# Patient Record
Sex: Male | Born: 1945
Health system: Southern US, Community
[De-identification: ages and names within clinical notes are randomized; demographics above are authoritative.]

## PROBLEM LIST (undated history)

## (undated) DIAGNOSIS — R079 Chest pain, unspecified: Secondary | ICD-10-CM

## (undated) DIAGNOSIS — E7800 Pure hypercholesterolemia, unspecified: Secondary | ICD-10-CM

## (undated) DIAGNOSIS — M1990 Unspecified osteoarthritis, unspecified site: Secondary | ICD-10-CM

## (undated) DIAGNOSIS — J189 Pneumonia, unspecified organism: Secondary | ICD-10-CM

## (undated) HISTORY — DX: Unspecified osteoarthritis, unspecified site: M19.90

## (undated) HISTORY — DX: Chest pain, unspecified: R07.9

## (undated) HISTORY — DX: Pure hypercholesterolemia, unspecified: E78.00

## (undated) HISTORY — PX: LUMBAR DISC SURGERY: SHX700

## (undated) HISTORY — DX: Pneumonia, unspecified organism: J18.9

## (undated) HISTORY — PX: CARDIAC CATHETERIZATION: SHX172

---

## 1998-08-10 ENCOUNTER — Ambulatory Visit (HOSPITAL_COMMUNITY): Admission: RE | Admit: 1998-08-10 | Discharge: 1998-08-10 | Payer: Self-pay | Admitting: *Deleted

## 1999-01-05 ENCOUNTER — Emergency Department (HOSPITAL_COMMUNITY): Admission: EM | Admit: 1999-01-05 | Discharge: 1999-01-05 | Payer: Self-pay | Admitting: Emergency Medicine

## 2003-02-14 ENCOUNTER — Encounter: Payer: Self-pay | Admitting: Neurosurgery

## 2003-02-14 ENCOUNTER — Encounter: Payer: Self-pay | Admitting: Diagnostic Radiology

## 2003-02-14 ENCOUNTER — Encounter: Admission: RE | Admit: 2003-02-14 | Discharge: 2003-02-14 | Payer: Self-pay | Admitting: Neurosurgery

## 2003-03-17 ENCOUNTER — Encounter: Payer: Self-pay | Admitting: Neurosurgery

## 2003-03-17 ENCOUNTER — Encounter: Admission: RE | Admit: 2003-03-17 | Discharge: 2003-03-17 | Payer: Self-pay | Admitting: Neurosurgery

## 2004-01-25 ENCOUNTER — Inpatient Hospital Stay (HOSPITAL_COMMUNITY): Admission: AD | Admit: 2004-01-25 | Discharge: 2004-01-26 | Payer: Self-pay | Admitting: Cardiology

## 2004-01-26 ENCOUNTER — Encounter (INDEPENDENT_AMBULATORY_CARE_PROVIDER_SITE_OTHER): Payer: Self-pay | Admitting: *Deleted

## 2006-03-26 ENCOUNTER — Encounter: Payer: Self-pay | Admitting: Cardiology

## 2006-03-28 ENCOUNTER — Encounter: Payer: Self-pay | Admitting: Cardiology

## 2006-03-28 ENCOUNTER — Encounter: Payer: Self-pay | Admitting: Thoracic Surgery (Cardiothoracic Vascular Surgery)

## 2006-03-28 ENCOUNTER — Inpatient Hospital Stay (HOSPITAL_COMMUNITY)
Admission: AD | Admit: 2006-03-28 | Discharge: 2006-04-04 | Payer: Self-pay | Admitting: Thoracic Surgery (Cardiothoracic Vascular Surgery)

## 2006-04-02 ENCOUNTER — Encounter: Payer: Self-pay | Admitting: Thoracic Surgery (Cardiothoracic Vascular Surgery)

## 2006-04-13 ENCOUNTER — Encounter
Admission: RE | Admit: 2006-04-13 | Discharge: 2006-04-13 | Payer: Self-pay | Admitting: Thoracic Surgery (Cardiothoracic Vascular Surgery)

## 2010-02-27 ENCOUNTER — Encounter: Payer: Self-pay | Admitting: Cardiology

## 2010-09-30 ENCOUNTER — Encounter: Payer: Self-pay | Admitting: Cardiology

## 2010-11-13 ENCOUNTER — Encounter: Payer: Self-pay | Admitting: Cardiology

## 2010-11-21 ENCOUNTER — Encounter: Payer: Self-pay | Admitting: Cardiology

## 2010-11-21 DIAGNOSIS — R079 Chest pain, unspecified: Secondary | ICD-10-CM

## 2010-12-04 NOTE — Letter (Signed)
Summary: External Correspondence/ OFFICE VISIT [facility]  External Correspondence/ OFFICE VISIT [facility]   Imported By: Dorise Hiss 11/28/2010 09:53:41  _____________________________________________________________________  External Attachment:    Type:   Image     Comment:   External Document

## 2010-12-11 DIAGNOSIS — E7800 Pure hypercholesterolemia, unspecified: Secondary | ICD-10-CM | POA: Insufficient documentation

## 2010-12-11 DIAGNOSIS — R079 Chest pain, unspecified: Secondary | ICD-10-CM | POA: Insufficient documentation

## 2010-12-11 DIAGNOSIS — J90 Pleural effusion, not elsewhere classified: Secondary | ICD-10-CM | POA: Insufficient documentation

## 2010-12-12 ENCOUNTER — Ambulatory Visit: Payer: Self-pay | Admitting: Cardiology

## 2010-12-19 ENCOUNTER — Ambulatory Visit (INDEPENDENT_AMBULATORY_CARE_PROVIDER_SITE_OTHER): Payer: 59 | Admitting: Cardiology

## 2010-12-19 ENCOUNTER — Encounter: Payer: Self-pay | Admitting: Cardiology

## 2010-12-19 DIAGNOSIS — J869 Pyothorax without fistula: Secondary | ICD-10-CM | POA: Insufficient documentation

## 2010-12-19 DIAGNOSIS — R072 Precordial pain: Secondary | ICD-10-CM

## 2010-12-19 DIAGNOSIS — R9430 Abnormal result of cardiovascular function study, unspecified: Secondary | ICD-10-CM

## 2010-12-19 NOTE — Cardiovascular Report (Signed)
Summary: CARDIAC CATHETERIZATION  CARDIAC CATHETERIZATION   Imported By: Zachary George 12/11/2010 16:30:49  _____________________________________________________________________  External Attachment:    Type:   Image     Comment:   External Document

## 2010-12-19 NOTE — Op Note (Signed)
Summary: Operative Report/  [facility]  Operative Report/  [facility]   Imported By: Dorise Hiss 12/11/2010 16:31:34  _____________________________________________________________________  External Attachment:    Type:   Image     Comment:   External Document

## 2010-12-19 NOTE — Letter (Signed)
Summary: Discharge Summary  Discharge Summary   Imported By: Dorise Hiss 12/11/2010 16:35:24  _____________________________________________________________________  External Attachment:    Type:   Image     Comment:   External Document

## 2010-12-19 NOTE — Letter (Signed)
Summary: [facility] D/C   [facility] D/C   Imported By: Zachary George 12/11/2010 16:12:04  _____________________________________________________________________  External Attachment:    Type:   Image     Comment:   External Document

## 2010-12-19 NOTE — Letter (Signed)
Summary: [facility] D/C DR. Selinda Flavin  [facility] D/C DR. Selinda Flavin   Imported By: Zachary George 12/11/2010 16:07:04  _____________________________________________________________________  External Attachment:    Type:   Image     Comment:   External Document

## 2010-12-24 NOTE — Assessment & Plan Note (Signed)
Summary: NP-LAST SEEN IN 2005 [facility] PER CONVERSATION WITH DR. Leotis Shames...   Visit Type:  Initial Consult Primary Provider:  Prentiss Bells   History of Present Illness: The patient had a recent stress test.  He exercised for a total duration of 8 minutes and 59 seconds.  He was able to achieve 10 metabolic equivalents.  There was no ST segment depression.  He was in normal sinus rhythm.  He did achieve 85% of maximum predicted heart rate.  No chest pain was induced.  The ventricular systolic function was normal with an ejection fraction of 65%.  There was a large partially reversible apical and mid inferior inferoseptal defect associated with normal wall motion. The patient had blood work done by his primary care physician less than a year ago.  The patient had an HDL of 47-mg percent and an LDL hundred and 6 mg percent.  Total cholesterol was 173. The patient has a prior history of right middle lobe necrotizing pneumonia with abscess as well as effusion empyema that required VATS drainage and decortication procedure.  This was in 2007. The patient had a cardiac catheterization done in 2005.  He had normal coronary arteries and normal LV function. The patient reports that he had an episode approximately 3 to 4 weeks ago of substernal chest pain while in church.  This episode lasted approximately 10 minutes.  It felt sharp in nature with some radiation to the back.  There was no associated shortness of breath diaphoresis or dizziness.  The patient has remained active since that time. He goes frequently to the [facility].  When he works out he reports no symptoms.   Preventive Screening-Counseling & Management  Alcohol-Tobacco     Smoking Status: quit     Year Quit: 1969  Current Medications (verified): 1)  Lipitor 10 Mg Tabs (Atorvastatin Calcium) .... Take 1/2 Tablet By Mouth Once A Day 2)  Aspir-Low 81 Mg Tbec (Aspirin) .... Take 1 Tablet By Mouth Once A Day 3)  Multivitamins  Tabs (Multiple  Vitamin) .... Take 1 Tablet By Mouth Once A Day 4)  Amitriptyline Hcl 25 Mg Tabs (Amitriptyline Hcl) .... Take 1 Tablet By Mouth Once A Day 5)  Glucosamine Chondroitin Complx  Caps (Glucosamine-Chondroit-Vit C-Mn) .... Take 1 Tablet By Mouth Once A Day Move Free  Allergies (verified): No Known Drug Allergies  Comments:  Nurse/Medical Assistant: The patient's medication list and allergies were reviewed with the patient and were updated in the Medication and Allergy Lists.  Past History:  Past Medical History: Last updated: 12/11/2010 Chest pains hx of osteoarthritis History of hypercholesterolemia.  Right middle lobe necrotizing pneumonia with abscess, as well as      effusion and empyema, status post right VATS drainage of empyema and      decortication.  Past Surgical History: Last updated: 12/11/2010 Cardiac Catheterization 2005 2 lumbosacral disk surgeries (one in late 80;s and another one in 1998)  Right video-assisted thoracoscopic drainage of empyema.  Family History: Last updated: 09-Jan-2011 Father: died had CAD and DM.   Father was a smoker Mother: deceased CAD and DM  Social History: Last updated: 12/11/2010 Retired from [employer] Tobacco Use - No.  Alcohol Use - no Regular Exercise - yes  Risk Factors: Smoking Status: quit (01/09/11)  Family History: Father: died had CAD and DM.   Father was a smoker Mother: deceased CAD and DM  Social History: Smoking Status:  quit  Review of Systems  The patient denies fatigue, malaise, fever,  weight gain/loss, vision loss, decreased hearing, hoarseness, chest pain, palpitations, shortness of breath, prolonged cough, wheezing, sleep apnea, coughing up blood, abdominal pain, blood in stool, nausea, vomiting, diarrhea, heartburn, incontinence, blood in urine, muscle weakness, joint pain, leg swelling, rash, skin lesions, headache, fainting, dizziness, depression, anxiety, enlarged lymph nodes, easy bruising or  bleeding, and environmental allergies.    Vital Signs:  Patient profile:   65 year old male Height:      74 inches Weight:      180 pounds BMI:     23.19 Pulse rate:   69 / minute BP sitting:   129 / 77  (left arm) Cuff size:   regular  Vitals Entered By: Carlye Grippe (December 19, 2010 10:30 AM)  Physical Exam  Additional Exam:  General: Well-developed, well-nourished in no distress head: Normocephalic and atraumatic eyes PERRLA/EOMI intact, conjunctiva and lids normal nose: No deformity or lesions mouth normal dentition, normal posterior pharynx neck: Supple, no JVD.  No masses, thyromegaly or abnormal cervical nodes lungs: Normal breath sounds bilaterally without wheezing.  Normal percussion heart: regular rate and rhythm with normal S1 and S2, no S3 or S4.  PMI is normal.  No pathological murmurs abdomen: Normal bowel sounds, abdomen is soft and nontender without masses, organomegaly or hernias noted.  No hepatosplenomegaly musculoskeletal: Back normal, normal gait muscle strength and tone normal pulsus: Pulse is normal in all 4 extremities Extremities: No peripheral pitting edema neurologic: Alert and oriented x 3 skin: Intact without lesions or rashes cervical nodes: No significant adenopathy psychologic: Normal affect    Echocardiogram  Procedure date:  12/19/2010  Findings:       GE Vscan limited ECHO study was performed  there was normal LV and RV function.  There were no segmental wall motion abnormalities.  Aortic mitral and tricuspid valves appeared within normal limits.   Impression & Recommendations:  Problem # 1:  CHEST PAIN-UNSPECIFIED (ICD-786.50) equivocal nuclear perfusion study: Nuclear perfusion study showed an inferior defect which aspect in retrospect is likely diaphragmatic attenuation.  The patient had no chest pain and there were no EKG changes.  He had good exercise tolerance.  We performed a bedside echocardiogram in the office today and the  patient had normal wall motion and normal LV function.  There was no evidence of valvular heart disease.  At this point I do not think is an indication to proceed with a diagnostic cardiac catheterization.  The patient's pretest probability for coronary arteries he is very low.  I suspect false-positive Cardiolite perfusion study.  Of note is that he also had a normal cardiac catheterization in 2005. His updated medication list for this problem includes:    Aspir-low 81 Mg Tbec (Aspirin) .Marland Kitchen... Take 1 tablet by mouth once a day  Problem # 2:  PURE HYPERCHOLESTEROLEMIA (ICD-272.0) well-controlled on low-dose Lipitor. His updated medication list for this problem includes:    Lipitor 10 Mg Tabs (Atorvastatin calcium) .Marland Kitchen... Take 1/2 tablet by mouth once a day  Problem # 3:  EMPYEMA (ICD-510.9) the patient has a history of empyema and decortication procedure.   He has no recurrent problems and this is stable.  Patient Instructions: 1)  Your physician recommends that you continue on your current medications as directed. Please refer to the Current Medication list given to you today. 2)  Follow up as needed

## 2010-12-31 NOTE — Letter (Signed)
Summary: Internal Other/  PATIENT HISTORY FORM  Internal Other/  PATIENT HISTORY FORM   Imported By: Dorise Hiss 12/23/2010 15:56:11  _____________________________________________________________________  External Attachment:    Type:   Image     Comment:   External Document

## 2011-08-21 ENCOUNTER — Other Ambulatory Visit: Payer: Self-pay | Admitting: Neurosurgery

## 2011-08-21 DIAGNOSIS — M549 Dorsalgia, unspecified: Secondary | ICD-10-CM

## 2011-08-25 ENCOUNTER — Ambulatory Visit
Admission: RE | Admit: 2011-08-25 | Discharge: 2011-08-25 | Disposition: A | Discharge status: Home / Self Care | Payer: 59 | Source: Ambulatory Visit | Attending: Neurosurgery | Admitting: Neurosurgery

## 2011-08-25 VITALS — BP 117/75 | HR 59

## 2011-08-25 DIAGNOSIS — M549 Dorsalgia, unspecified: Secondary | ICD-10-CM

## 2011-08-25 MED ORDER — IOHEXOL 180 MG/ML  SOLN
16.0000 mL | Freq: Once | INTRAMUSCULAR | Status: AC | PRN
  Administered 2011-08-25: 16 mL via INTRATHECAL

## 2011-08-25 MED ORDER — DIAZEPAM 5 MG PO TABS
10.0000 mg | ORAL_TABLET | Freq: Once | ORAL | Status: AC
  Administered 2011-08-25: 10 mg via ORAL

## 2011-08-25 NOTE — Progress Notes (Signed)
States he has been off amitriptyline for the past two days.  jkl

## 2012-04-29 ENCOUNTER — Encounter: Payer: Self-pay | Admitting: Cardiology

## 2012-10-25 DIAGNOSIS — M5137 Other intervertebral disc degeneration, lumbosacral region: Secondary | ICD-10-CM | POA: Diagnosis not present

## 2012-10-25 DIAGNOSIS — M999 Biomechanical lesion, unspecified: Secondary | ICD-10-CM | POA: Diagnosis not present

## 2012-11-01 DIAGNOSIS — M999 Biomechanical lesion, unspecified: Secondary | ICD-10-CM | POA: Diagnosis not present

## 2012-11-01 DIAGNOSIS — M5137 Other intervertebral disc degeneration, lumbosacral region: Secondary | ICD-10-CM | POA: Diagnosis not present

## 2012-11-08 DIAGNOSIS — M5137 Other intervertebral disc degeneration, lumbosacral region: Secondary | ICD-10-CM | POA: Diagnosis not present

## 2012-11-08 DIAGNOSIS — M999 Biomechanical lesion, unspecified: Secondary | ICD-10-CM | POA: Diagnosis not present

## 2012-11-15 DIAGNOSIS — M5137 Other intervertebral disc degeneration, lumbosacral region: Secondary | ICD-10-CM | POA: Diagnosis not present

## 2012-11-15 DIAGNOSIS — M999 Biomechanical lesion, unspecified: Secondary | ICD-10-CM | POA: Diagnosis not present

## 2012-11-18 DIAGNOSIS — Z0000 Encounter for general adult medical examination without abnormal findings: Secondary | ICD-10-CM | POA: Diagnosis not present

## 2012-11-18 DIAGNOSIS — E7800 Pure hypercholesterolemia, unspecified: Secondary | ICD-10-CM | POA: Diagnosis not present

## 2012-11-22 DIAGNOSIS — M5137 Other intervertebral disc degeneration, lumbosacral region: Secondary | ICD-10-CM | POA: Diagnosis not present

## 2012-11-22 DIAGNOSIS — M999 Biomechanical lesion, unspecified: Secondary | ICD-10-CM | POA: Diagnosis not present

## 2012-11-29 DIAGNOSIS — M999 Biomechanical lesion, unspecified: Secondary | ICD-10-CM | POA: Diagnosis not present

## 2012-11-29 DIAGNOSIS — M5137 Other intervertebral disc degeneration, lumbosacral region: Secondary | ICD-10-CM | POA: Diagnosis not present

## 2012-12-06 DIAGNOSIS — M5137 Other intervertebral disc degeneration, lumbosacral region: Secondary | ICD-10-CM | POA: Diagnosis not present

## 2012-12-20 DIAGNOSIS — M999 Biomechanical lesion, unspecified: Secondary | ICD-10-CM | POA: Diagnosis not present

## 2012-12-27 DIAGNOSIS — M5137 Other intervertebral disc degeneration, lumbosacral region: Secondary | ICD-10-CM | POA: Diagnosis not present

## 2013-01-03 DIAGNOSIS — M5137 Other intervertebral disc degeneration, lumbosacral region: Secondary | ICD-10-CM | POA: Diagnosis not present

## 2013-01-03 DIAGNOSIS — M999 Biomechanical lesion, unspecified: Secondary | ICD-10-CM | POA: Diagnosis not present

## 2013-01-10 DIAGNOSIS — M999 Biomechanical lesion, unspecified: Secondary | ICD-10-CM | POA: Diagnosis not present

## 2013-01-10 DIAGNOSIS — M5137 Other intervertebral disc degeneration, lumbosacral region: Secondary | ICD-10-CM | POA: Diagnosis not present

## 2013-01-17 DIAGNOSIS — M5137 Other intervertebral disc degeneration, lumbosacral region: Secondary | ICD-10-CM | POA: Diagnosis not present

## 2013-01-17 DIAGNOSIS — M999 Biomechanical lesion, unspecified: Secondary | ICD-10-CM | POA: Diagnosis not present

## 2013-01-24 DIAGNOSIS — M5137 Other intervertebral disc degeneration, lumbosacral region: Secondary | ICD-10-CM | POA: Diagnosis not present

## 2013-01-24 DIAGNOSIS — M999 Biomechanical lesion, unspecified: Secondary | ICD-10-CM | POA: Diagnosis not present

## 2013-02-11 DIAGNOSIS — M25519 Pain in unspecified shoulder: Secondary | ICD-10-CM | POA: Diagnosis not present

## 2013-02-23 DIAGNOSIS — M19049 Primary osteoarthritis, unspecified hand: Secondary | ICD-10-CM | POA: Diagnosis not present

## 2013-02-23 DIAGNOSIS — M67919 Unspecified disorder of synovium and tendon, unspecified shoulder: Secondary | ICD-10-CM | POA: Diagnosis not present

## 2013-02-23 DIAGNOSIS — M75120 Complete rotator cuff tear or rupture of unspecified shoulder, not specified as traumatic: Secondary | ICD-10-CM | POA: Diagnosis not present

## 2013-03-02 DIAGNOSIS — M67919 Unspecified disorder of synovium and tendon, unspecified shoulder: Secondary | ICD-10-CM | POA: Diagnosis not present

## 2013-03-02 DIAGNOSIS — M75120 Complete rotator cuff tear or rupture of unspecified shoulder, not specified as traumatic: Secondary | ICD-10-CM | POA: Diagnosis not present

## 2013-03-02 DIAGNOSIS — M19049 Primary osteoarthritis, unspecified hand: Secondary | ICD-10-CM | POA: Diagnosis not present

## 2013-03-02 DIAGNOSIS — M719 Bursopathy, unspecified: Secondary | ICD-10-CM | POA: Diagnosis not present

## 2013-03-09 DIAGNOSIS — M67919 Unspecified disorder of synovium and tendon, unspecified shoulder: Secondary | ICD-10-CM | POA: Diagnosis not present

## 2013-03-09 DIAGNOSIS — M75120 Complete rotator cuff tear or rupture of unspecified shoulder, not specified as traumatic: Secondary | ICD-10-CM | POA: Diagnosis not present

## 2013-03-09 DIAGNOSIS — M719 Bursopathy, unspecified: Secondary | ICD-10-CM | POA: Diagnosis not present

## 2013-03-09 DIAGNOSIS — M19049 Primary osteoarthritis, unspecified hand: Secondary | ICD-10-CM | POA: Diagnosis not present

## 2013-03-16 DIAGNOSIS — M719 Bursopathy, unspecified: Secondary | ICD-10-CM | POA: Diagnosis not present

## 2013-03-16 DIAGNOSIS — M19049 Primary osteoarthritis, unspecified hand: Secondary | ICD-10-CM | POA: Diagnosis not present

## 2013-03-16 DIAGNOSIS — M75120 Complete rotator cuff tear or rupture of unspecified shoulder, not specified as traumatic: Secondary | ICD-10-CM | POA: Diagnosis not present

## 2013-03-16 DIAGNOSIS — M67919 Unspecified disorder of synovium and tendon, unspecified shoulder: Secondary | ICD-10-CM | POA: Diagnosis not present

## 2013-03-18 DIAGNOSIS — M67919 Unspecified disorder of synovium and tendon, unspecified shoulder: Secondary | ICD-10-CM | POA: Diagnosis not present

## 2013-03-18 DIAGNOSIS — M719 Bursopathy, unspecified: Secondary | ICD-10-CM | POA: Diagnosis not present

## 2013-03-18 DIAGNOSIS — M7710 Lateral epicondylitis, unspecified elbow: Secondary | ICD-10-CM | POA: Diagnosis not present

## 2013-03-18 DIAGNOSIS — M19049 Primary osteoarthritis, unspecified hand: Secondary | ICD-10-CM | POA: Diagnosis not present

## 2013-04-06 DIAGNOSIS — E7800 Pure hypercholesterolemia, unspecified: Secondary | ICD-10-CM | POA: Diagnosis not present

## 2013-07-20 DIAGNOSIS — Z23 Encounter for immunization: Secondary | ICD-10-CM | POA: Diagnosis not present

## 2013-09-07 DIAGNOSIS — M545 Low back pain: Secondary | ICD-10-CM | POA: Diagnosis not present

## 2013-09-07 DIAGNOSIS — Z23 Encounter for immunization: Secondary | ICD-10-CM | POA: Diagnosis not present

## 2013-09-07 DIAGNOSIS — Z79899 Other long term (current) drug therapy: Secondary | ICD-10-CM | POA: Diagnosis not present

## 2013-09-07 DIAGNOSIS — E7800 Pure hypercholesterolemia, unspecified: Secondary | ICD-10-CM | POA: Diagnosis not present

## 2013-09-07 DIAGNOSIS — Z125 Encounter for screening for malignant neoplasm of prostate: Secondary | ICD-10-CM | POA: Diagnosis not present

## 2013-09-07 DIAGNOSIS — Z0000 Encounter for general adult medical examination without abnormal findings: Secondary | ICD-10-CM | POA: Diagnosis not present

## 2013-10-10 DIAGNOSIS — Z79899 Other long term (current) drug therapy: Secondary | ICD-10-CM | POA: Diagnosis not present

## 2013-12-26 DIAGNOSIS — N390 Urinary tract infection, site not specified: Secondary | ICD-10-CM | POA: Diagnosis not present

## 2014-02-09 DIAGNOSIS — M5137 Other intervertebral disc degeneration, lumbosacral region: Secondary | ICD-10-CM | POA: Diagnosis not present

## 2014-02-09 DIAGNOSIS — M545 Low back pain, unspecified: Secondary | ICD-10-CM | POA: Diagnosis not present

## 2014-03-02 ENCOUNTER — Other Ambulatory Visit: Payer: Self-pay | Admitting: Geriatric Medicine

## 2014-03-02 ENCOUNTER — Ambulatory Visit
Admission: RE | Admit: 2014-03-02 | Discharge: 2014-03-02 | Disposition: A | Discharge status: Home / Self Care | Payer: Medicare Other | Source: Ambulatory Visit | Attending: Geriatric Medicine | Admitting: Geriatric Medicine

## 2014-03-02 DIAGNOSIS — R05 Cough: Secondary | ICD-10-CM

## 2014-03-02 DIAGNOSIS — R059 Cough, unspecified: Secondary | ICD-10-CM

## 2014-03-02 DIAGNOSIS — J398 Other specified diseases of upper respiratory tract: Secondary | ICD-10-CM | POA: Diagnosis not present

## 2014-03-02 DIAGNOSIS — J988 Other specified respiratory disorders: Secondary | ICD-10-CM | POA: Diagnosis not present

## 2014-04-13 DIAGNOSIS — M545 Low back pain, unspecified: Secondary | ICD-10-CM | POA: Diagnosis not present

## 2014-04-13 DIAGNOSIS — M5137 Other intervertebral disc degeneration, lumbosacral region: Secondary | ICD-10-CM | POA: Diagnosis not present

## 2014-05-11 DIAGNOSIS — M545 Low back pain, unspecified: Secondary | ICD-10-CM | POA: Diagnosis not present

## 2014-05-11 DIAGNOSIS — M5137 Other intervertebral disc degeneration, lumbosacral region: Secondary | ICD-10-CM | POA: Diagnosis not present

## 2014-06-12 DIAGNOSIS — M545 Low back pain, unspecified: Secondary | ICD-10-CM | POA: Diagnosis not present

## 2014-06-12 DIAGNOSIS — M5137 Other intervertebral disc degeneration, lumbosacral region: Secondary | ICD-10-CM | POA: Diagnosis not present

## 2014-07-12 DIAGNOSIS — M19049 Primary osteoarthritis, unspecified hand: Secondary | ICD-10-CM | POA: Diagnosis not present

## 2014-07-19 DIAGNOSIS — Z23 Encounter for immunization: Secondary | ICD-10-CM | POA: Diagnosis not present

## 2014-09-11 DIAGNOSIS — Z79899 Other long term (current) drug therapy: Secondary | ICD-10-CM | POA: Diagnosis not present

## 2014-09-11 DIAGNOSIS — Z23 Encounter for immunization: Secondary | ICD-10-CM | POA: Diagnosis not present

## 2014-09-11 DIAGNOSIS — E780 Pure hypercholesterolemia: Secondary | ICD-10-CM | POA: Diagnosis not present

## 2014-09-11 DIAGNOSIS — Z789 Other specified health status: Secondary | ICD-10-CM | POA: Diagnosis not present

## 2014-09-11 DIAGNOSIS — Z0000 Encounter for general adult medical examination without abnormal findings: Secondary | ICD-10-CM | POA: Diagnosis not present

## 2014-09-11 DIAGNOSIS — Z1389 Encounter for screening for other disorder: Secondary | ICD-10-CM | POA: Diagnosis not present

## 2015-06-04 DIAGNOSIS — D171 Benign lipomatous neoplasm of skin and subcutaneous tissue of trunk: Secondary | ICD-10-CM | POA: Diagnosis not present

## 2015-06-25 DIAGNOSIS — M25561 Pain in right knee: Secondary | ICD-10-CM | POA: Diagnosis not present

## 2015-07-04 DIAGNOSIS — J342 Deviated nasal septum: Secondary | ICD-10-CM | POA: Diagnosis not present

## 2015-07-04 DIAGNOSIS — R040 Epistaxis: Secondary | ICD-10-CM | POA: Diagnosis not present

## 2015-08-15 DIAGNOSIS — Z23 Encounter for immunization: Secondary | ICD-10-CM | POA: Diagnosis not present

## 2015-09-17 DIAGNOSIS — E7800 Pure hypercholesterolemia, unspecified: Secondary | ICD-10-CM | POA: Diagnosis not present

## 2015-09-17 DIAGNOSIS — Z79899 Other long term (current) drug therapy: Secondary | ICD-10-CM | POA: Diagnosis not present

## 2015-09-17 DIAGNOSIS — Z1389 Encounter for screening for other disorder: Secondary | ICD-10-CM | POA: Diagnosis not present

## 2015-09-17 DIAGNOSIS — Z0000 Encounter for general adult medical examination without abnormal findings: Secondary | ICD-10-CM | POA: Diagnosis not present

## 2015-09-17 DIAGNOSIS — D179 Benign lipomatous neoplasm, unspecified: Secondary | ICD-10-CM | POA: Diagnosis not present

## 2015-09-18 DIAGNOSIS — M62830 Muscle spasm of back: Secondary | ICD-10-CM | POA: Diagnosis not present

## 2015-09-18 DIAGNOSIS — M9904 Segmental and somatic dysfunction of sacral region: Secondary | ICD-10-CM | POA: Diagnosis not present

## 2015-09-18 DIAGNOSIS — M25551 Pain in right hip: Secondary | ICD-10-CM | POA: Diagnosis not present

## 2015-09-18 DIAGNOSIS — M25552 Pain in left hip: Secondary | ICD-10-CM | POA: Diagnosis not present

## 2015-09-18 DIAGNOSIS — M5136 Other intervertebral disc degeneration, lumbar region: Secondary | ICD-10-CM | POA: Diagnosis not present

## 2015-09-18 DIAGNOSIS — M5126 Other intervertebral disc displacement, lumbar region: Secondary | ICD-10-CM | POA: Diagnosis not present

## 2015-09-18 DIAGNOSIS — M9905 Segmental and somatic dysfunction of pelvic region: Secondary | ICD-10-CM | POA: Diagnosis not present

## 2015-09-18 DIAGNOSIS — M9903 Segmental and somatic dysfunction of lumbar region: Secondary | ICD-10-CM | POA: Diagnosis not present

## 2015-09-18 DIAGNOSIS — M5386 Other specified dorsopathies, lumbar region: Secondary | ICD-10-CM | POA: Diagnosis not present

## 2015-09-25 DIAGNOSIS — M25551 Pain in right hip: Secondary | ICD-10-CM | POA: Diagnosis not present

## 2015-09-25 DIAGNOSIS — M5386 Other specified dorsopathies, lumbar region: Secondary | ICD-10-CM | POA: Diagnosis not present

## 2015-09-25 DIAGNOSIS — M5126 Other intervertebral disc displacement, lumbar region: Secondary | ICD-10-CM | POA: Diagnosis not present

## 2015-09-25 DIAGNOSIS — M9904 Segmental and somatic dysfunction of sacral region: Secondary | ICD-10-CM | POA: Diagnosis not present

## 2015-09-25 DIAGNOSIS — M9905 Segmental and somatic dysfunction of pelvic region: Secondary | ICD-10-CM | POA: Diagnosis not present

## 2015-09-25 DIAGNOSIS — M5136 Other intervertebral disc degeneration, lumbar region: Secondary | ICD-10-CM | POA: Diagnosis not present

## 2015-09-25 DIAGNOSIS — M25552 Pain in left hip: Secondary | ICD-10-CM | POA: Diagnosis not present

## 2015-09-25 DIAGNOSIS — M9903 Segmental and somatic dysfunction of lumbar region: Secondary | ICD-10-CM | POA: Diagnosis not present

## 2015-09-25 DIAGNOSIS — M62830 Muscle spasm of back: Secondary | ICD-10-CM | POA: Diagnosis not present

## 2015-09-26 DIAGNOSIS — M5386 Other specified dorsopathies, lumbar region: Secondary | ICD-10-CM | POA: Diagnosis not present

## 2015-09-26 DIAGNOSIS — M9904 Segmental and somatic dysfunction of sacral region: Secondary | ICD-10-CM | POA: Diagnosis not present

## 2015-09-26 DIAGNOSIS — M5126 Other intervertebral disc displacement, lumbar region: Secondary | ICD-10-CM | POA: Diagnosis not present

## 2015-09-26 DIAGNOSIS — M25552 Pain in left hip: Secondary | ICD-10-CM | POA: Diagnosis not present

## 2015-09-26 DIAGNOSIS — M9903 Segmental and somatic dysfunction of lumbar region: Secondary | ICD-10-CM | POA: Diagnosis not present

## 2015-09-26 DIAGNOSIS — M25551 Pain in right hip: Secondary | ICD-10-CM | POA: Diagnosis not present

## 2015-09-26 DIAGNOSIS — M5136 Other intervertebral disc degeneration, lumbar region: Secondary | ICD-10-CM | POA: Diagnosis not present

## 2015-09-26 DIAGNOSIS — M9905 Segmental and somatic dysfunction of pelvic region: Secondary | ICD-10-CM | POA: Diagnosis not present

## 2015-09-26 DIAGNOSIS — M62830 Muscle spasm of back: Secondary | ICD-10-CM | POA: Diagnosis not present

## 2015-09-27 DIAGNOSIS — M5386 Other specified dorsopathies, lumbar region: Secondary | ICD-10-CM | POA: Diagnosis not present

## 2015-09-27 DIAGNOSIS — M25552 Pain in left hip: Secondary | ICD-10-CM | POA: Diagnosis not present

## 2015-09-27 DIAGNOSIS — M9904 Segmental and somatic dysfunction of sacral region: Secondary | ICD-10-CM | POA: Diagnosis not present

## 2015-09-27 DIAGNOSIS — M5126 Other intervertebral disc displacement, lumbar region: Secondary | ICD-10-CM | POA: Diagnosis not present

## 2015-09-27 DIAGNOSIS — M5136 Other intervertebral disc degeneration, lumbar region: Secondary | ICD-10-CM | POA: Diagnosis not present

## 2015-09-27 DIAGNOSIS — M62830 Muscle spasm of back: Secondary | ICD-10-CM | POA: Diagnosis not present

## 2015-09-27 DIAGNOSIS — M9905 Segmental and somatic dysfunction of pelvic region: Secondary | ICD-10-CM | POA: Diagnosis not present

## 2015-09-27 DIAGNOSIS — M25551 Pain in right hip: Secondary | ICD-10-CM | POA: Diagnosis not present

## 2015-09-27 DIAGNOSIS — M9903 Segmental and somatic dysfunction of lumbar region: Secondary | ICD-10-CM | POA: Diagnosis not present

## 2015-10-01 DIAGNOSIS — M5386 Other specified dorsopathies, lumbar region: Secondary | ICD-10-CM | POA: Diagnosis not present

## 2015-10-01 DIAGNOSIS — M25552 Pain in left hip: Secondary | ICD-10-CM | POA: Diagnosis not present

## 2015-10-01 DIAGNOSIS — M5126 Other intervertebral disc displacement, lumbar region: Secondary | ICD-10-CM | POA: Diagnosis not present

## 2015-10-01 DIAGNOSIS — M25551 Pain in right hip: Secondary | ICD-10-CM | POA: Diagnosis not present

## 2015-10-01 DIAGNOSIS — M5136 Other intervertebral disc degeneration, lumbar region: Secondary | ICD-10-CM | POA: Diagnosis not present

## 2015-10-01 DIAGNOSIS — M9905 Segmental and somatic dysfunction of pelvic region: Secondary | ICD-10-CM | POA: Diagnosis not present

## 2015-10-01 DIAGNOSIS — M9904 Segmental and somatic dysfunction of sacral region: Secondary | ICD-10-CM | POA: Diagnosis not present

## 2015-10-01 DIAGNOSIS — M62830 Muscle spasm of back: Secondary | ICD-10-CM | POA: Diagnosis not present

## 2015-10-01 DIAGNOSIS — M9903 Segmental and somatic dysfunction of lumbar region: Secondary | ICD-10-CM | POA: Diagnosis not present

## 2015-10-03 DIAGNOSIS — M9903 Segmental and somatic dysfunction of lumbar region: Secondary | ICD-10-CM | POA: Diagnosis not present

## 2015-10-03 DIAGNOSIS — M25552 Pain in left hip: Secondary | ICD-10-CM | POA: Diagnosis not present

## 2015-10-03 DIAGNOSIS — M9905 Segmental and somatic dysfunction of pelvic region: Secondary | ICD-10-CM | POA: Diagnosis not present

## 2015-10-03 DIAGNOSIS — M9904 Segmental and somatic dysfunction of sacral region: Secondary | ICD-10-CM | POA: Diagnosis not present

## 2015-10-03 DIAGNOSIS — M5386 Other specified dorsopathies, lumbar region: Secondary | ICD-10-CM | POA: Diagnosis not present

## 2015-10-03 DIAGNOSIS — M5136 Other intervertebral disc degeneration, lumbar region: Secondary | ICD-10-CM | POA: Diagnosis not present

## 2015-10-03 DIAGNOSIS — M25551 Pain in right hip: Secondary | ICD-10-CM | POA: Diagnosis not present

## 2015-10-03 DIAGNOSIS — M5126 Other intervertebral disc displacement, lumbar region: Secondary | ICD-10-CM | POA: Diagnosis not present

## 2015-10-03 DIAGNOSIS — M62830 Muscle spasm of back: Secondary | ICD-10-CM | POA: Diagnosis not present

## 2015-10-04 DIAGNOSIS — M9903 Segmental and somatic dysfunction of lumbar region: Secondary | ICD-10-CM | POA: Diagnosis not present

## 2015-10-04 DIAGNOSIS — M62830 Muscle spasm of back: Secondary | ICD-10-CM | POA: Diagnosis not present

## 2015-10-04 DIAGNOSIS — M9905 Segmental and somatic dysfunction of pelvic region: Secondary | ICD-10-CM | POA: Diagnosis not present

## 2015-10-04 DIAGNOSIS — M5136 Other intervertebral disc degeneration, lumbar region: Secondary | ICD-10-CM | POA: Diagnosis not present

## 2015-10-04 DIAGNOSIS — M9904 Segmental and somatic dysfunction of sacral region: Secondary | ICD-10-CM | POA: Diagnosis not present

## 2015-10-04 DIAGNOSIS — M25551 Pain in right hip: Secondary | ICD-10-CM | POA: Diagnosis not present

## 2015-10-04 DIAGNOSIS — M25552 Pain in left hip: Secondary | ICD-10-CM | POA: Diagnosis not present

## 2015-10-04 DIAGNOSIS — M5386 Other specified dorsopathies, lumbar region: Secondary | ICD-10-CM | POA: Diagnosis not present

## 2015-10-04 DIAGNOSIS — M5126 Other intervertebral disc displacement, lumbar region: Secondary | ICD-10-CM | POA: Diagnosis not present

## 2015-10-09 DIAGNOSIS — M25552 Pain in left hip: Secondary | ICD-10-CM | POA: Diagnosis not present

## 2015-10-09 DIAGNOSIS — M5126 Other intervertebral disc displacement, lumbar region: Secondary | ICD-10-CM | POA: Diagnosis not present

## 2015-10-09 DIAGNOSIS — M5386 Other specified dorsopathies, lumbar region: Secondary | ICD-10-CM | POA: Diagnosis not present

## 2015-10-09 DIAGNOSIS — M9904 Segmental and somatic dysfunction of sacral region: Secondary | ICD-10-CM | POA: Diagnosis not present

## 2015-10-09 DIAGNOSIS — M9905 Segmental and somatic dysfunction of pelvic region: Secondary | ICD-10-CM | POA: Diagnosis not present

## 2015-10-09 DIAGNOSIS — M62830 Muscle spasm of back: Secondary | ICD-10-CM | POA: Diagnosis not present

## 2015-10-09 DIAGNOSIS — M25551 Pain in right hip: Secondary | ICD-10-CM | POA: Diagnosis not present

## 2015-10-09 DIAGNOSIS — M9903 Segmental and somatic dysfunction of lumbar region: Secondary | ICD-10-CM | POA: Diagnosis not present

## 2015-10-09 DIAGNOSIS — M5136 Other intervertebral disc degeneration, lumbar region: Secondary | ICD-10-CM | POA: Diagnosis not present

## 2015-10-10 DIAGNOSIS — M9905 Segmental and somatic dysfunction of pelvic region: Secondary | ICD-10-CM | POA: Diagnosis not present

## 2015-10-10 DIAGNOSIS — M25551 Pain in right hip: Secondary | ICD-10-CM | POA: Diagnosis not present

## 2015-10-10 DIAGNOSIS — M25552 Pain in left hip: Secondary | ICD-10-CM | POA: Diagnosis not present

## 2015-10-10 DIAGNOSIS — M5126 Other intervertebral disc displacement, lumbar region: Secondary | ICD-10-CM | POA: Diagnosis not present

## 2015-10-10 DIAGNOSIS — M9903 Segmental and somatic dysfunction of lumbar region: Secondary | ICD-10-CM | POA: Diagnosis not present

## 2015-10-10 DIAGNOSIS — M5386 Other specified dorsopathies, lumbar region: Secondary | ICD-10-CM | POA: Diagnosis not present

## 2015-10-10 DIAGNOSIS — M5136 Other intervertebral disc degeneration, lumbar region: Secondary | ICD-10-CM | POA: Diagnosis not present

## 2015-10-10 DIAGNOSIS — M62830 Muscle spasm of back: Secondary | ICD-10-CM | POA: Diagnosis not present

## 2015-10-10 DIAGNOSIS — M9904 Segmental and somatic dysfunction of sacral region: Secondary | ICD-10-CM | POA: Diagnosis not present

## 2015-10-11 DIAGNOSIS — M5126 Other intervertebral disc displacement, lumbar region: Secondary | ICD-10-CM | POA: Diagnosis not present

## 2015-10-11 DIAGNOSIS — M5136 Other intervertebral disc degeneration, lumbar region: Secondary | ICD-10-CM | POA: Diagnosis not present

## 2015-10-11 DIAGNOSIS — M25552 Pain in left hip: Secondary | ICD-10-CM | POA: Diagnosis not present

## 2015-10-11 DIAGNOSIS — M9904 Segmental and somatic dysfunction of sacral region: Secondary | ICD-10-CM | POA: Diagnosis not present

## 2015-10-11 DIAGNOSIS — M9903 Segmental and somatic dysfunction of lumbar region: Secondary | ICD-10-CM | POA: Diagnosis not present

## 2015-10-11 DIAGNOSIS — M9905 Segmental and somatic dysfunction of pelvic region: Secondary | ICD-10-CM | POA: Diagnosis not present

## 2015-10-11 DIAGNOSIS — M25551 Pain in right hip: Secondary | ICD-10-CM | POA: Diagnosis not present

## 2015-10-11 DIAGNOSIS — M5386 Other specified dorsopathies, lumbar region: Secondary | ICD-10-CM | POA: Diagnosis not present

## 2015-10-11 DIAGNOSIS — M62830 Muscle spasm of back: Secondary | ICD-10-CM | POA: Diagnosis not present

## 2015-10-16 DIAGNOSIS — M9903 Segmental and somatic dysfunction of lumbar region: Secondary | ICD-10-CM | POA: Diagnosis not present

## 2015-10-16 DIAGNOSIS — M25551 Pain in right hip: Secondary | ICD-10-CM | POA: Diagnosis not present

## 2015-10-16 DIAGNOSIS — M25552 Pain in left hip: Secondary | ICD-10-CM | POA: Diagnosis not present

## 2015-10-16 DIAGNOSIS — M5386 Other specified dorsopathies, lumbar region: Secondary | ICD-10-CM | POA: Diagnosis not present

## 2015-10-16 DIAGNOSIS — M9905 Segmental and somatic dysfunction of pelvic region: Secondary | ICD-10-CM | POA: Diagnosis not present

## 2015-10-16 DIAGNOSIS — M62830 Muscle spasm of back: Secondary | ICD-10-CM | POA: Diagnosis not present

## 2015-10-16 DIAGNOSIS — M5136 Other intervertebral disc degeneration, lumbar region: Secondary | ICD-10-CM | POA: Diagnosis not present

## 2015-10-16 DIAGNOSIS — M9904 Segmental and somatic dysfunction of sacral region: Secondary | ICD-10-CM | POA: Diagnosis not present

## 2015-10-16 DIAGNOSIS — M5126 Other intervertebral disc displacement, lumbar region: Secondary | ICD-10-CM | POA: Diagnosis not present

## 2015-10-18 DIAGNOSIS — M9904 Segmental and somatic dysfunction of sacral region: Secondary | ICD-10-CM | POA: Diagnosis not present

## 2015-10-18 DIAGNOSIS — M62830 Muscle spasm of back: Secondary | ICD-10-CM | POA: Diagnosis not present

## 2015-10-18 DIAGNOSIS — M9903 Segmental and somatic dysfunction of lumbar region: Secondary | ICD-10-CM | POA: Diagnosis not present

## 2015-10-18 DIAGNOSIS — M25552 Pain in left hip: Secondary | ICD-10-CM | POA: Diagnosis not present

## 2015-10-18 DIAGNOSIS — M5386 Other specified dorsopathies, lumbar region: Secondary | ICD-10-CM | POA: Diagnosis not present

## 2015-10-18 DIAGNOSIS — M5126 Other intervertebral disc displacement, lumbar region: Secondary | ICD-10-CM | POA: Diagnosis not present

## 2015-10-18 DIAGNOSIS — M25551 Pain in right hip: Secondary | ICD-10-CM | POA: Diagnosis not present

## 2015-10-18 DIAGNOSIS — M9905 Segmental and somatic dysfunction of pelvic region: Secondary | ICD-10-CM | POA: Diagnosis not present

## 2015-10-18 DIAGNOSIS — M5136 Other intervertebral disc degeneration, lumbar region: Secondary | ICD-10-CM | POA: Diagnosis not present

## 2015-10-23 DIAGNOSIS — M25551 Pain in right hip: Secondary | ICD-10-CM | POA: Diagnosis not present

## 2015-10-23 DIAGNOSIS — M9904 Segmental and somatic dysfunction of sacral region: Secondary | ICD-10-CM | POA: Diagnosis not present

## 2015-10-23 DIAGNOSIS — M5136 Other intervertebral disc degeneration, lumbar region: Secondary | ICD-10-CM | POA: Diagnosis not present

## 2015-10-23 DIAGNOSIS — M9905 Segmental and somatic dysfunction of pelvic region: Secondary | ICD-10-CM | POA: Diagnosis not present

## 2015-10-23 DIAGNOSIS — M5126 Other intervertebral disc displacement, lumbar region: Secondary | ICD-10-CM | POA: Diagnosis not present

## 2015-10-23 DIAGNOSIS — M5386 Other specified dorsopathies, lumbar region: Secondary | ICD-10-CM | POA: Diagnosis not present

## 2015-10-23 DIAGNOSIS — M9903 Segmental and somatic dysfunction of lumbar region: Secondary | ICD-10-CM | POA: Diagnosis not present

## 2015-10-23 DIAGNOSIS — M25552 Pain in left hip: Secondary | ICD-10-CM | POA: Diagnosis not present

## 2015-10-23 DIAGNOSIS — M62830 Muscle spasm of back: Secondary | ICD-10-CM | POA: Diagnosis not present

## 2015-10-24 DIAGNOSIS — M25552 Pain in left hip: Secondary | ICD-10-CM | POA: Diagnosis not present

## 2015-10-24 DIAGNOSIS — M9905 Segmental and somatic dysfunction of pelvic region: Secondary | ICD-10-CM | POA: Diagnosis not present

## 2015-10-24 DIAGNOSIS — M5126 Other intervertebral disc displacement, lumbar region: Secondary | ICD-10-CM | POA: Diagnosis not present

## 2015-10-24 DIAGNOSIS — M9904 Segmental and somatic dysfunction of sacral region: Secondary | ICD-10-CM | POA: Diagnosis not present

## 2015-10-24 DIAGNOSIS — M5386 Other specified dorsopathies, lumbar region: Secondary | ICD-10-CM | POA: Diagnosis not present

## 2015-10-24 DIAGNOSIS — M62830 Muscle spasm of back: Secondary | ICD-10-CM | POA: Diagnosis not present

## 2015-10-24 DIAGNOSIS — M5136 Other intervertebral disc degeneration, lumbar region: Secondary | ICD-10-CM | POA: Diagnosis not present

## 2015-10-24 DIAGNOSIS — M25551 Pain in right hip: Secondary | ICD-10-CM | POA: Diagnosis not present

## 2015-10-24 DIAGNOSIS — M9903 Segmental and somatic dysfunction of lumbar region: Secondary | ICD-10-CM | POA: Diagnosis not present

## 2015-10-29 DIAGNOSIS — M25551 Pain in right hip: Secondary | ICD-10-CM | POA: Diagnosis not present

## 2015-10-29 DIAGNOSIS — M5386 Other specified dorsopathies, lumbar region: Secondary | ICD-10-CM | POA: Diagnosis not present

## 2015-10-29 DIAGNOSIS — M5126 Other intervertebral disc displacement, lumbar region: Secondary | ICD-10-CM | POA: Diagnosis not present

## 2015-10-29 DIAGNOSIS — M9903 Segmental and somatic dysfunction of lumbar region: Secondary | ICD-10-CM | POA: Diagnosis not present

## 2015-10-29 DIAGNOSIS — M62830 Muscle spasm of back: Secondary | ICD-10-CM | POA: Diagnosis not present

## 2015-10-29 DIAGNOSIS — M5136 Other intervertebral disc degeneration, lumbar region: Secondary | ICD-10-CM | POA: Diagnosis not present

## 2015-10-29 DIAGNOSIS — M25552 Pain in left hip: Secondary | ICD-10-CM | POA: Diagnosis not present

## 2015-10-29 DIAGNOSIS — M9905 Segmental and somatic dysfunction of pelvic region: Secondary | ICD-10-CM | POA: Diagnosis not present

## 2015-10-29 DIAGNOSIS — M9904 Segmental and somatic dysfunction of sacral region: Secondary | ICD-10-CM | POA: Diagnosis not present

## 2015-11-01 DIAGNOSIS — M9903 Segmental and somatic dysfunction of lumbar region: Secondary | ICD-10-CM | POA: Diagnosis not present

## 2015-11-01 DIAGNOSIS — M25551 Pain in right hip: Secondary | ICD-10-CM | POA: Diagnosis not present

## 2015-11-01 DIAGNOSIS — M9904 Segmental and somatic dysfunction of sacral region: Secondary | ICD-10-CM | POA: Diagnosis not present

## 2015-11-01 DIAGNOSIS — M25552 Pain in left hip: Secondary | ICD-10-CM | POA: Diagnosis not present

## 2015-11-01 DIAGNOSIS — M62830 Muscle spasm of back: Secondary | ICD-10-CM | POA: Diagnosis not present

## 2015-11-01 DIAGNOSIS — M5386 Other specified dorsopathies, lumbar region: Secondary | ICD-10-CM | POA: Diagnosis not present

## 2015-11-01 DIAGNOSIS — M5126 Other intervertebral disc displacement, lumbar region: Secondary | ICD-10-CM | POA: Diagnosis not present

## 2015-11-01 DIAGNOSIS — M5136 Other intervertebral disc degeneration, lumbar region: Secondary | ICD-10-CM | POA: Diagnosis not present

## 2015-11-01 DIAGNOSIS — M9905 Segmental and somatic dysfunction of pelvic region: Secondary | ICD-10-CM | POA: Diagnosis not present

## 2015-11-07 DIAGNOSIS — M5136 Other intervertebral disc degeneration, lumbar region: Secondary | ICD-10-CM | POA: Diagnosis not present

## 2015-11-07 DIAGNOSIS — M9903 Segmental and somatic dysfunction of lumbar region: Secondary | ICD-10-CM | POA: Diagnosis not present

## 2015-11-07 DIAGNOSIS — M9904 Segmental and somatic dysfunction of sacral region: Secondary | ICD-10-CM | POA: Diagnosis not present

## 2015-11-07 DIAGNOSIS — M9905 Segmental and somatic dysfunction of pelvic region: Secondary | ICD-10-CM | POA: Diagnosis not present

## 2015-11-07 DIAGNOSIS — M25552 Pain in left hip: Secondary | ICD-10-CM | POA: Diagnosis not present

## 2015-11-07 DIAGNOSIS — M25551 Pain in right hip: Secondary | ICD-10-CM | POA: Diagnosis not present

## 2015-11-07 DIAGNOSIS — M5386 Other specified dorsopathies, lumbar region: Secondary | ICD-10-CM | POA: Diagnosis not present

## 2015-11-07 DIAGNOSIS — M5126 Other intervertebral disc displacement, lumbar region: Secondary | ICD-10-CM | POA: Diagnosis not present

## 2015-11-07 DIAGNOSIS — M62830 Muscle spasm of back: Secondary | ICD-10-CM | POA: Diagnosis not present

## 2015-11-29 DIAGNOSIS — M9905 Segmental and somatic dysfunction of pelvic region: Secondary | ICD-10-CM | POA: Diagnosis not present

## 2015-11-29 DIAGNOSIS — M25552 Pain in left hip: Secondary | ICD-10-CM | POA: Diagnosis not present

## 2015-11-29 DIAGNOSIS — M5126 Other intervertebral disc displacement, lumbar region: Secondary | ICD-10-CM | POA: Diagnosis not present

## 2015-11-29 DIAGNOSIS — M5386 Other specified dorsopathies, lumbar region: Secondary | ICD-10-CM | POA: Diagnosis not present

## 2015-11-29 DIAGNOSIS — M9903 Segmental and somatic dysfunction of lumbar region: Secondary | ICD-10-CM | POA: Diagnosis not present

## 2015-11-29 DIAGNOSIS — M25551 Pain in right hip: Secondary | ICD-10-CM | POA: Diagnosis not present

## 2015-11-29 DIAGNOSIS — M5136 Other intervertebral disc degeneration, lumbar region: Secondary | ICD-10-CM | POA: Diagnosis not present

## 2015-11-29 DIAGNOSIS — M62830 Muscle spasm of back: Secondary | ICD-10-CM | POA: Diagnosis not present

## 2015-11-29 DIAGNOSIS — M9904 Segmental and somatic dysfunction of sacral region: Secondary | ICD-10-CM | POA: Diagnosis not present

## 2016-02-29 DIAGNOSIS — M79675 Pain in left toe(s): Secondary | ICD-10-CM | POA: Diagnosis not present

## 2016-03-31 DIAGNOSIS — M4186 Other forms of scoliosis, lumbar region: Secondary | ICD-10-CM | POA: Diagnosis not present

## 2016-03-31 DIAGNOSIS — M47817 Spondylosis without myelopathy or radiculopathy, lumbosacral region: Secondary | ICD-10-CM | POA: Diagnosis not present

## 2016-03-31 DIAGNOSIS — M47816 Spondylosis without myelopathy or radiculopathy, lumbar region: Secondary | ICD-10-CM | POA: Diagnosis not present

## 2016-07-09 DIAGNOSIS — Z23 Encounter for immunization: Secondary | ICD-10-CM | POA: Diagnosis not present

## 2016-09-25 DIAGNOSIS — Z1389 Encounter for screening for other disorder: Secondary | ICD-10-CM | POA: Diagnosis not present

## 2016-09-25 DIAGNOSIS — G8929 Other chronic pain: Secondary | ICD-10-CM | POA: Diagnosis not present

## 2016-09-25 DIAGNOSIS — F5101 Primary insomnia: Secondary | ICD-10-CM | POA: Diagnosis not present

## 2016-09-25 DIAGNOSIS — Z125 Encounter for screening for malignant neoplasm of prostate: Secondary | ICD-10-CM | POA: Diagnosis not present

## 2016-09-25 DIAGNOSIS — E7800 Pure hypercholesterolemia, unspecified: Secondary | ICD-10-CM | POA: Diagnosis not present

## 2016-09-25 DIAGNOSIS — M545 Low back pain: Secondary | ICD-10-CM | POA: Diagnosis not present

## 2016-09-25 DIAGNOSIS — Z0000 Encounter for general adult medical examination without abnormal findings: Secondary | ICD-10-CM | POA: Diagnosis not present

## 2016-09-25 DIAGNOSIS — Z79899 Other long term (current) drug therapy: Secondary | ICD-10-CM | POA: Diagnosis not present

## 2016-09-30 DIAGNOSIS — M545 Low back pain: Secondary | ICD-10-CM | POA: Diagnosis not present

## 2016-10-09 DIAGNOSIS — M545 Low back pain: Secondary | ICD-10-CM | POA: Diagnosis not present

## 2016-12-22 ENCOUNTER — Ambulatory Visit (HOSPITAL_COMMUNITY)
Admission: EM | Admit: 2016-12-22 | Discharge: 2016-12-22 | Disposition: A | Discharge status: Home / Self Care | Payer: Medicare Other | Attending: Emergency Medicine | Admitting: Emergency Medicine

## 2016-12-22 ENCOUNTER — Encounter (HOSPITAL_COMMUNITY): Payer: Self-pay | Admitting: Emergency Medicine

## 2016-12-22 ENCOUNTER — Ambulatory Visit (INDEPENDENT_AMBULATORY_CARE_PROVIDER_SITE_OTHER): Payer: Medicare Other

## 2016-12-22 DIAGNOSIS — R0781 Pleurodynia: Secondary | ICD-10-CM | POA: Diagnosis not present

## 2016-12-22 DIAGNOSIS — S299XXA Unspecified injury of thorax, initial encounter: Secondary | ICD-10-CM | POA: Diagnosis not present

## 2016-12-22 DIAGNOSIS — R079 Chest pain, unspecified: Secondary | ICD-10-CM | POA: Diagnosis not present

## 2016-12-22 MED ORDER — DICLOFENAC SODIUM 75 MG PO TBEC: 75.0000 mg | DELAYED_RELEASE_TABLET | Freq: Two times a day (BID) | ORAL | 0 refills | Status: DC

## 2016-12-22 NOTE — ED Triage Notes (Signed)
Pt. Stated, I fell in the snow on the left side. Called my doctor and said to come here.  I think I broke one of my ribs.

## 2016-12-22 NOTE — Discharge Instructions (Addendum)
There is no evidence of fracture on x-ray. However there was an area of "thickening" on the x-ray. And the radiologist recommends some point in the future, on a non-emergency basis, that you have a follow-up CT to rule out a mass. I have provided a copy of the radiology report, I recommend you take this to your primary care provider sometime in the next week to the next month any have a discussion with him regarding appropriate follow-up care. With regard to your pain, I prescribed a medicine called diclofenac, take one tablet twice a day. I recommend rest, you may apply ice to the affected area 15 minutes at a time up to 4 times a day, he may alternate with heat. He can take several weeks for the pain in the ribs to heal.

## 2016-12-22 NOTE — ED Provider Notes (Signed)
CSN: [id number]     Arrival date & time 12/22/16  1834 History   None    Chief Complaint  Patient presents with  . Fall  . Chest Pain    left   (Consider location/radiation/quality/duration/timing/severity/associated sxs/prior Treatment) 71 year old male resents to clinic for evaluation of left-sided rib pain. He reports he had slipped on some concrete earlier today in the snow landing on his side. He did not strike his head, no loss of consciousness, no headache, blurred vision, nausea vomiting, or other systemic signs of distress. He has had no shortness of breath, no hemoptysis, no exertional dyspnea. His pain is worse with deep inspiration, and worse when laying on the affected side. He denies any other complaints.   The history is provided by the patient.    Past Medical History:  Diagnosis Date  . Chest pain   . Hypercholesterolemia   . Osteoarthritis   . Pneumonia    right middle lobe necrotizing pneumonia with abcess as well ass effusion and empyena and decortication   Past Surgical History:  Procedure Laterality Date  . CARDIAC CATHETERIZATION     2005  . LUMBAR DISC SURGERY     Family History  Problem Relation Age of Onset  . Heart disease Father    Social History  Substance Use Topics  . Smoking status: Never Smoker  . Smokeless tobacco: Never Used  . Alcohol use Yes    Review of Systems  Reason unable to perform ROS: as covered in HPI.  All other systems reviewed and are negative.   Allergies  Patient has no known allergies.  Home Medications   Prior to Admission medications   Medication Sig Start Date End Date Taking? Authorizing Provider  amitriptyline (ELAVIL) 25 MG tablet Take 25 mg by mouth at bedtime.    Historical Provider, MD  aspirin 81 MG tablet Take 81 mg by mouth daily.    Historical Provider, MD  atorvastatin (LIPITOR) 10 MG tablet Take 10 mg by mouth daily.    Historical Provider, MD  diclofenac (VOLTAREN) 75 MG EC tablet Take 1 tablet  (75 mg total) by mouth 2 (two) times daily. 12/22/16   Barnet Glasgow, NP  glucosamine-chondroitin 500-400 MG tablet Take 0.5 tablets by mouth 3 (three) times daily.    Historical Provider, MD  Multiple Vitamins-Minerals (MULTIVITAMIN PO) Take by mouth.    Historical Provider, MD   Meds Ordered and Administered this Visit  Medications - No data to display  BP 123/63 (BP Location: Right Arm)   Pulse 68   Temp 98.2 F (36.8 C) (Oral)   Resp 17   Ht 6' 1.5" (1.867 m)   Wt 175 lb (79.4 kg)   SpO2 98%   BMI 22.78 kg/m  No data found.   Physical Exam  Constitutional: He is oriented to person, place, and time. He appears well-developed and well-nourished. No distress.  HENT:  Head: Normocephalic and atraumatic.  Right Ear: External ear normal.  Left Ear: External ear normal.  Neck: Normal range of motion. Neck supple.  Cardiovascular: Normal rate and regular rhythm.   Pulmonary/Chest: Effort normal and breath sounds normal. No respiratory distress. He has no wheezes. He exhibits tenderness. He exhibits no crepitus, no deformity and no retraction.  Left sided tenderness, mid axillary at the level of the 6th rib  Abdominal: Soft. Bowel sounds are normal.  Neurological: He is alert and oriented to person, place, and time.  Skin: Skin is warm and dry. Capillary refill takes  less than 2 seconds. He is not diaphoretic.  Psychiatric: He has a normal mood and affect. His behavior is normal.  Nursing note and vitals reviewed.   Urgent Care Course     Procedures (including critical care time)  Labs Review Labs Reviewed - No data to display  Imaging Review Dg Ribs Unilateral W/chest Left  Result Date: 12/22/2016 CLINICAL DATA:  71 year old male with fall and chest pain. EXAM: LEFT RIBS AND CHEST - 3+ VIEW COMPARISON:  Chest radiograph dated 03/02/2014 FINDINGS: There is a focal area of streaky density and parenchymal prominence in the right infrahilar region which appears slightly more  prominent compared to the prior radiograph. This likely is related to atelectatic changes/ scarring and superimposition of the structures. A developing mass is much less likely but not entirely excluded. This can be further evaluated with chest CT on a non emergent basis. The lungs are otherwise clear. There is no pleural effusion or pneumothorax. The cardiac silhouette is within normal limits. The aorta is mildly tortuous. The bones are osteopenic. No acute osseous pathology identified. IMPRESSION: 1. No acute cardiopulmonary process. No definite fracture identified. 2. Minimal prominence of the right infrahilar region, likely combination of superimposition of the hilar vessels and atelectasis/scarring. A developing mass is much less likely but not entirely excluded. Electronically Signed   By: Anner Crete M.D.   On: 12/22/2016 20:01       MDM   1. Rib pain on left side    There is no evidence of fracture on x-ray. However there was an area of "thickening" on the x-ray. And the radiologist recommends some point in the future, on a non-emergency basis, that you have a follow-up CT to rule out a mass. I have provided a copy of the radiology report, I recommend you take this to your primary care provider sometime in the next week to the next month any have a discussion with him regarding appropriate follow-up care. With regard to your pain, I prescribed a medicine called diclofenac, take one tablet twice a day. I recommend rest, you may apply ice to the affected area 15 minutes at a time up to 4 times a day, he may alternate with heat. He can take several weeks for the pain in the ribs to heal.      Barnet Glasgow, NP 12/22/16 2150

## 2016-12-25 ENCOUNTER — Ambulatory Visit
Admission: RE | Admit: 2016-12-25 | Discharge: 2016-12-25 | Disposition: A | Discharge status: Home / Self Care | Payer: Medicare Other | Source: Ambulatory Visit | Attending: Geriatric Medicine | Admitting: Geriatric Medicine

## 2016-12-25 ENCOUNTER — Other Ambulatory Visit: Payer: Self-pay | Admitting: Geriatric Medicine

## 2016-12-25 DIAGNOSIS — R0789 Other chest pain: Secondary | ICD-10-CM

## 2016-12-25 DIAGNOSIS — R938 Abnormal findings on diagnostic imaging of other specified body structures: Secondary | ICD-10-CM | POA: Diagnosis not present

## 2016-12-25 DIAGNOSIS — R0602 Shortness of breath: Secondary | ICD-10-CM | POA: Diagnosis not present

## 2016-12-26 ENCOUNTER — Ambulatory Visit
Admission: RE | Admit: 2016-12-26 | Discharge: 2016-12-26 | Disposition: A | Discharge status: Home / Self Care | Payer: Medicare Other | Source: Ambulatory Visit | Attending: Geriatric Medicine | Admitting: Geriatric Medicine

## 2016-12-26 ENCOUNTER — Other Ambulatory Visit: Payer: Self-pay | Admitting: Geriatric Medicine

## 2016-12-26 DIAGNOSIS — R079 Chest pain, unspecified: Secondary | ICD-10-CM | POA: Diagnosis not present

## 2016-12-26 DIAGNOSIS — J939 Pneumothorax, unspecified: Secondary | ICD-10-CM

## 2016-12-29 ENCOUNTER — Other Ambulatory Visit: Payer: Self-pay | Admitting: Geriatric Medicine

## 2016-12-29 ENCOUNTER — Ambulatory Visit
Admission: RE | Admit: 2016-12-29 | Discharge: 2016-12-29 | Disposition: A | Discharge status: Home / Self Care | Payer: Medicare Other | Source: Ambulatory Visit | Attending: Geriatric Medicine | Admitting: Geriatric Medicine

## 2016-12-29 DIAGNOSIS — J939 Pneumothorax, unspecified: Secondary | ICD-10-CM

## 2017-01-07 ENCOUNTER — Other Ambulatory Visit: Payer: Self-pay | Admitting: Geriatric Medicine

## 2017-01-07 ENCOUNTER — Ambulatory Visit
Admission: RE | Admit: 2017-01-07 | Discharge: 2017-01-07 | Disposition: A | Discharge status: Home / Self Care | Payer: Medicare Other | Source: Ambulatory Visit | Attending: Geriatric Medicine | Admitting: Geriatric Medicine

## 2017-01-07 DIAGNOSIS — R0602 Shortness of breath: Secondary | ICD-10-CM | POA: Diagnosis not present

## 2017-01-07 DIAGNOSIS — S270XXS Traumatic pneumothorax, sequela: Secondary | ICD-10-CM

## 2017-01-28 ENCOUNTER — Ambulatory Visit
Admission: RE | Admit: 2017-01-28 | Discharge: 2017-01-28 | Disposition: A | Discharge status: Home / Self Care | Payer: Medicare Other | Source: Ambulatory Visit | Attending: Geriatric Medicine | Admitting: Geriatric Medicine

## 2017-01-28 ENCOUNTER — Other Ambulatory Visit: Payer: Self-pay | Admitting: Geriatric Medicine

## 2017-01-28 DIAGNOSIS — R0602 Shortness of breath: Secondary | ICD-10-CM | POA: Diagnosis not present

## 2017-01-28 DIAGNOSIS — R918 Other nonspecific abnormal finding of lung field: Secondary | ICD-10-CM

## 2017-01-28 DIAGNOSIS — R05 Cough: Secondary | ICD-10-CM | POA: Diagnosis not present

## 2017-02-12 ENCOUNTER — Other Ambulatory Visit: Payer: Self-pay | Admitting: Geriatric Medicine

## 2017-02-12 ENCOUNTER — Ambulatory Visit
Admission: RE | Admit: 2017-02-12 | Discharge: 2017-02-12 | Disposition: A | Discharge status: Home / Self Care | Payer: Medicare Other | Source: Ambulatory Visit | Attending: Geriatric Medicine | Admitting: Geriatric Medicine

## 2017-02-12 DIAGNOSIS — R05 Cough: Secondary | ICD-10-CM | POA: Diagnosis not present

## 2017-02-12 DIAGNOSIS — R9389 Abnormal findings on diagnostic imaging of other specified body structures: Secondary | ICD-10-CM

## 2017-02-24 ENCOUNTER — Other Ambulatory Visit: Payer: Self-pay | Admitting: Geriatric Medicine

## 2017-02-24 DIAGNOSIS — R9389 Abnormal findings on diagnostic imaging of other specified body structures: Secondary | ICD-10-CM

## 2017-02-25 ENCOUNTER — Ambulatory Visit
Admission: RE | Admit: 2017-02-25 | Discharge: 2017-02-25 | Disposition: A | Discharge status: Home / Self Care | Payer: Medicare Other | Source: Ambulatory Visit | Attending: Geriatric Medicine | Admitting: Geriatric Medicine

## 2017-02-25 DIAGNOSIS — R9389 Abnormal findings on diagnostic imaging of other specified body structures: Secondary | ICD-10-CM

## 2017-02-25 DIAGNOSIS — R918 Other nonspecific abnormal finding of lung field: Secondary | ICD-10-CM | POA: Diagnosis not present

## 2017-06-18 DIAGNOSIS — M545 Low back pain: Secondary | ICD-10-CM | POA: Diagnosis not present

## 2017-06-18 DIAGNOSIS — M791 Myalgia: Secondary | ICD-10-CM | POA: Diagnosis not present

## 2017-06-18 DIAGNOSIS — M4716 Other spondylosis with myelopathy, lumbar region: Secondary | ICD-10-CM | POA: Diagnosis not present

## 2017-06-19 ENCOUNTER — Other Ambulatory Visit: Payer: Self-pay | Admitting: Orthopaedic Surgery

## 2017-06-19 DIAGNOSIS — M4710 Other spondylosis with myelopathy, site unspecified: Secondary | ICD-10-CM

## 2017-06-24 ENCOUNTER — Other Ambulatory Visit: Payer: Self-pay | Admitting: Physician Assistant

## 2017-06-24 DIAGNOSIS — C44519 Basal cell carcinoma of skin of other part of trunk: Secondary | ICD-10-CM | POA: Diagnosis not present

## 2017-06-24 DIAGNOSIS — C4491 Basal cell carcinoma of skin, unspecified: Secondary | ICD-10-CM

## 2017-06-24 DIAGNOSIS — D229 Melanocytic nevi, unspecified: Secondary | ICD-10-CM | POA: Diagnosis not present

## 2017-06-24 HISTORY — DX: Basal cell carcinoma of skin, unspecified: C44.91

## 2017-06-28 ENCOUNTER — Other Ambulatory Visit: Payer: Medicare Other

## 2017-07-13 ENCOUNTER — Ambulatory Visit
Admission: RE | Admit: 2017-07-13 | Discharge: 2017-07-13 | Disposition: A | Discharge status: Home / Self Care | Payer: Medicare Other | Source: Ambulatory Visit | Attending: Orthopaedic Surgery | Admitting: Orthopaedic Surgery

## 2017-07-13 DIAGNOSIS — M4710 Other spondylosis with myelopathy, site unspecified: Secondary | ICD-10-CM

## 2017-07-13 DIAGNOSIS — M48061 Spinal stenosis, lumbar region without neurogenic claudication: Secondary | ICD-10-CM | POA: Diagnosis not present

## 2017-07-13 MED ORDER — GADOBENATE DIMEGLUMINE 529 MG/ML IV SOLN
15.0000 mL | Freq: Once | INTRAVENOUS | Status: AC | PRN
  Administered 2017-07-13: 15 mL via INTRAVENOUS

## 2017-07-13 MED ORDER — GADOBENATE DIMEGLUMINE 529 MG/ML IV SOLN: 15.0000 mL | Freq: Once | INTRAVENOUS | Status: DC | PRN

## 2017-07-16 DIAGNOSIS — M4716 Other spondylosis with myelopathy, lumbar region: Secondary | ICD-10-CM | POA: Diagnosis not present

## 2017-07-16 DIAGNOSIS — M5106 Intervertebral disc disorders with myelopathy, lumbar region: Secondary | ICD-10-CM | POA: Diagnosis not present

## 2017-07-16 DIAGNOSIS — M5416 Radiculopathy, lumbar region: Secondary | ICD-10-CM | POA: Diagnosis not present

## 2017-07-16 DIAGNOSIS — C44519 Basal cell carcinoma of skin of other part of trunk: Secondary | ICD-10-CM | POA: Diagnosis not present

## 2017-08-05 DIAGNOSIS — Z23 Encounter for immunization: Secondary | ICD-10-CM | POA: Diagnosis not present

## 2017-09-11 DIAGNOSIS — Z4689 Encounter for fitting and adjustment of other specified devices: Secondary | ICD-10-CM | POA: Diagnosis not present

## 2017-09-11 DIAGNOSIS — M5106 Intervertebral disc disorders with myelopathy, lumbar region: Secondary | ICD-10-CM | POA: Diagnosis not present

## 2017-09-11 DIAGNOSIS — M4716 Other spondylosis with myelopathy, lumbar region: Secondary | ICD-10-CM | POA: Diagnosis not present

## 2017-09-11 DIAGNOSIS — M5416 Radiculopathy, lumbar region: Secondary | ICD-10-CM | POA: Diagnosis not present

## 2017-09-11 DIAGNOSIS — M961 Postlaminectomy syndrome, not elsewhere classified: Secondary | ICD-10-CM | POA: Diagnosis not present

## 2017-09-23 DIAGNOSIS — M5106 Intervertebral disc disorders with myelopathy, lumbar region: Secondary | ICD-10-CM | POA: Diagnosis not present

## 2017-09-23 DIAGNOSIS — M4716 Other spondylosis with myelopathy, lumbar region: Secondary | ICD-10-CM | POA: Diagnosis not present

## 2017-09-23 DIAGNOSIS — M5416 Radiculopathy, lumbar region: Secondary | ICD-10-CM | POA: Diagnosis not present

## 2017-09-23 DIAGNOSIS — Z01818 Encounter for other preprocedural examination: Secondary | ICD-10-CM | POA: Diagnosis not present

## 2017-09-23 DIAGNOSIS — Z01810 Encounter for preprocedural cardiovascular examination: Secondary | ICD-10-CM | POA: Diagnosis not present

## 2017-09-23 DIAGNOSIS — M961 Postlaminectomy syndrome, not elsewhere classified: Secondary | ICD-10-CM | POA: Diagnosis not present

## 2017-09-28 DIAGNOSIS — M5137 Other intervertebral disc degeneration, lumbosacral region: Secondary | ICD-10-CM | POA: Diagnosis present

## 2017-09-28 DIAGNOSIS — Z7982 Long term (current) use of aspirin: Secondary | ICD-10-CM | POA: Diagnosis not present

## 2017-09-28 DIAGNOSIS — M4327 Fusion of spine, lumbosacral region: Secondary | ICD-10-CM | POA: Diagnosis not present

## 2017-09-28 DIAGNOSIS — Z79899 Other long term (current) drug therapy: Secondary | ICD-10-CM | POA: Diagnosis not present

## 2017-09-28 DIAGNOSIS — M5106 Intervertebral disc disorders with myelopathy, lumbar region: Secondary | ICD-10-CM | POA: Diagnosis not present

## 2017-09-28 DIAGNOSIS — M5136 Other intervertebral disc degeneration, lumbar region: Secondary | ICD-10-CM | POA: Diagnosis not present

## 2017-09-28 DIAGNOSIS — M4716 Other spondylosis with myelopathy, lumbar region: Secondary | ICD-10-CM | POA: Diagnosis not present

## 2017-09-28 DIAGNOSIS — M4326 Fusion of spine, lumbar region: Secondary | ICD-10-CM | POA: Diagnosis not present

## 2017-09-28 DIAGNOSIS — M5416 Radiculopathy, lumbar region: Secondary | ICD-10-CM | POA: Diagnosis not present

## 2017-09-28 DIAGNOSIS — M4807 Spinal stenosis, lumbosacral region: Secondary | ICD-10-CM | POA: Diagnosis present

## 2017-09-28 DIAGNOSIS — M961 Postlaminectomy syndrome, not elsewhere classified: Secondary | ICD-10-CM | POA: Diagnosis not present

## 2017-09-28 DIAGNOSIS — M532X6 Spinal instabilities, lumbar region: Secondary | ICD-10-CM | POA: Diagnosis not present

## 2017-09-29 DIAGNOSIS — M4716 Other spondylosis with myelopathy, lumbar region: Secondary | ICD-10-CM | POA: Insufficient documentation

## 2017-10-27 DIAGNOSIS — M5106 Intervertebral disc disorders with myelopathy, lumbar region: Secondary | ICD-10-CM | POA: Diagnosis not present

## 2017-10-28 DIAGNOSIS — D229 Melanocytic nevi, unspecified: Secondary | ICD-10-CM | POA: Diagnosis not present

## 2017-10-28 DIAGNOSIS — L918 Other hypertrophic disorders of the skin: Secondary | ICD-10-CM | POA: Diagnosis not present

## 2017-10-28 DIAGNOSIS — L570 Actinic keratosis: Secondary | ICD-10-CM | POA: Diagnosis not present

## 2017-11-02 DIAGNOSIS — Z0000 Encounter for general adult medical examination without abnormal findings: Secondary | ICD-10-CM | POA: Diagnosis not present

## 2017-11-02 DIAGNOSIS — Z1389 Encounter for screening for other disorder: Secondary | ICD-10-CM | POA: Diagnosis not present

## 2017-11-02 DIAGNOSIS — E7800 Pure hypercholesterolemia, unspecified: Secondary | ICD-10-CM | POA: Diagnosis not present

## 2017-11-02 DIAGNOSIS — Z125 Encounter for screening for malignant neoplasm of prostate: Secondary | ICD-10-CM | POA: Diagnosis not present

## 2017-11-02 DIAGNOSIS — Z79899 Other long term (current) drug therapy: Secondary | ICD-10-CM | POA: Diagnosis not present

## 2017-11-02 DIAGNOSIS — I700 Atherosclerosis of aorta: Secondary | ICD-10-CM | POA: Diagnosis not present

## 2017-11-04 DIAGNOSIS — H2513 Age-related nuclear cataract, bilateral: Secondary | ICD-10-CM | POA: Diagnosis not present

## 2017-12-23 DIAGNOSIS — M545 Low back pain: Secondary | ICD-10-CM | POA: Diagnosis not present

## 2017-12-23 DIAGNOSIS — M4326 Fusion of spine, lumbar region: Secondary | ICD-10-CM | POA: Diagnosis not present

## 2017-12-25 DIAGNOSIS — K5641 Fecal impaction: Secondary | ICD-10-CM | POA: Diagnosis not present

## 2018-01-04 DIAGNOSIS — K5901 Slow transit constipation: Secondary | ICD-10-CM | POA: Diagnosis not present

## 2018-01-04 DIAGNOSIS — R05 Cough: Secondary | ICD-10-CM | POA: Diagnosis not present

## 2018-01-06 DIAGNOSIS — M1812 Unilateral primary osteoarthritis of first carpometacarpal joint, left hand: Secondary | ICD-10-CM | POA: Diagnosis not present

## 2018-01-13 ENCOUNTER — Other Ambulatory Visit: Payer: Self-pay | Admitting: Orthopedic Surgery

## 2018-02-18 ENCOUNTER — Other Ambulatory Visit: Payer: Self-pay

## 2018-02-18 ENCOUNTER — Encounter (HOSPITAL_BASED_OUTPATIENT_CLINIC_OR_DEPARTMENT_OTHER): Payer: Self-pay | Admitting: *Deleted

## 2018-02-25 ENCOUNTER — Ambulatory Visit (HOSPITAL_BASED_OUTPATIENT_CLINIC_OR_DEPARTMENT_OTHER): Payer: Medicare Other | Admitting: Anesthesiology

## 2018-02-25 ENCOUNTER — Ambulatory Visit (HOSPITAL_BASED_OUTPATIENT_CLINIC_OR_DEPARTMENT_OTHER)
Admission: RE | Admit: 2018-02-25 | Discharge: 2018-02-25 | Disposition: A | Discharge status: Home / Self Care | Payer: Medicare Other | Source: Ambulatory Visit | Attending: Orthopedic Surgery | Admitting: Orthopedic Surgery

## 2018-02-25 ENCOUNTER — Encounter (HOSPITAL_BASED_OUTPATIENT_CLINIC_OR_DEPARTMENT_OTHER)
Admission: RE | Disposition: A | Discharge status: Home / Self Care | Payer: Self-pay | Source: Ambulatory Visit | Attending: Orthopedic Surgery

## 2018-02-25 ENCOUNTER — Other Ambulatory Visit: Payer: Self-pay

## 2018-02-25 ENCOUNTER — Encounter (HOSPITAL_BASED_OUTPATIENT_CLINIC_OR_DEPARTMENT_OTHER): Payer: Self-pay | Admitting: Anesthesiology

## 2018-02-25 DIAGNOSIS — Z7982 Long term (current) use of aspirin: Secondary | ICD-10-CM | POA: Insufficient documentation

## 2018-02-25 DIAGNOSIS — G8918 Other acute postprocedural pain: Secondary | ICD-10-CM | POA: Diagnosis not present

## 2018-02-25 DIAGNOSIS — J869 Pyothorax without fistula: Secondary | ICD-10-CM | POA: Diagnosis not present

## 2018-02-25 DIAGNOSIS — Z79899 Other long term (current) drug therapy: Secondary | ICD-10-CM | POA: Insufficient documentation

## 2018-02-25 DIAGNOSIS — E7800 Pure hypercholesterolemia, unspecified: Secondary | ICD-10-CM | POA: Insufficient documentation

## 2018-02-25 DIAGNOSIS — M1812 Unilateral primary osteoarthritis of first carpometacarpal joint, left hand: Secondary | ICD-10-CM | POA: Insufficient documentation

## 2018-02-25 HISTORY — PX: CARPOMETACARPEL SUSPENSION PLASTY: SHX5005

## 2018-02-25 SURGERY — CARPOMETACARPEL (CMC) SUSPENSION PLASTY
Anesthesia: General | Site: Hand | Laterality: Left

## 2018-02-25 MED ORDER — PROPOFOL 10 MG/ML IV BOLUS
INTRAVENOUS | Status: DC | PRN
  Administered 2018-02-25: 150 mg via INTRAVENOUS

## 2018-02-25 MED ORDER — LIDOCAINE HCL (CARDIAC) PF 100 MG/5ML IV SOSY
PREFILLED_SYRINGE | INTRAVENOUS | Status: DC | PRN
  Administered 2018-02-25: 80 mg via INTRAVENOUS

## 2018-02-25 MED ORDER — MIDAZOLAM HCL 2 MG/2ML IJ SOLN
INTRAMUSCULAR | Status: AC
  Filled 2018-02-25: qty 2

## 2018-02-25 MED ORDER — MIDAZOLAM HCL 2 MG/2ML IJ SOLN
1.0000 mg | INTRAMUSCULAR | Status: DC | PRN
  Administered 2018-02-25: 2 mg via INTRAVENOUS

## 2018-02-25 MED ORDER — CEFAZOLIN SODIUM-DEXTROSE 2-4 GM/100ML-% IV SOLN: 2.0000 g | INTRAVENOUS | Status: DC

## 2018-02-25 MED ORDER — HYDROCODONE-ACETAMINOPHEN 5-325 MG PO TABS: ORAL_TABLET | ORAL | 0 refills | Status: DC

## 2018-02-25 MED ORDER — FENTANYL CITRATE (PF) 100 MCG/2ML IJ SOLN
INTRAMUSCULAR | Status: AC
  Filled 2018-02-25: qty 2

## 2018-02-25 MED ORDER — ROPIVACAINE HCL 7.5 MG/ML IJ SOLN
INTRAMUSCULAR | Status: DC | PRN
  Administered 2018-02-25: 20 mL via PERINEURAL

## 2018-02-25 MED ORDER — DEXAMETHASONE SODIUM PHOSPHATE 4 MG/ML IJ SOLN
INTRAMUSCULAR | Status: DC | PRN
  Administered 2018-02-25 (×3): 10 mg via INTRAVENOUS

## 2018-02-25 MED ORDER — SCOPOLAMINE 1 MG/3DAYS TD PT72: 1.0000 | MEDICATED_PATCH | Freq: Once | TRANSDERMAL | Status: DC | PRN

## 2018-02-25 MED ORDER — DEXAMETHASONE SODIUM PHOSPHATE 10 MG/ML IJ SOLN
INTRAMUSCULAR | Status: AC
  Filled 2018-02-25: qty 1

## 2018-02-25 MED ORDER — ONDANSETRON HCL 4 MG/2ML IJ SOLN
INTRAMUSCULAR | Status: AC
  Filled 2018-02-25: qty 2

## 2018-02-25 MED ORDER — CEFAZOLIN SODIUM-DEXTROSE 2-4 GM/100ML-% IV SOLN
INTRAVENOUS | Status: AC
  Filled 2018-02-25: qty 100

## 2018-02-25 MED ORDER — LIDOCAINE HCL (CARDIAC) PF 100 MG/5ML IV SOSY
PREFILLED_SYRINGE | INTRAVENOUS | Status: AC
  Filled 2018-02-25: qty 5

## 2018-02-25 MED ORDER — ONDANSETRON HCL 4 MG/2ML IJ SOLN
INTRAMUSCULAR | Status: DC | PRN
  Administered 2018-02-25: 4 mg via INTRAVENOUS

## 2018-02-25 MED ORDER — LACTATED RINGERS IV SOLN
INTRAVENOUS | Status: DC
  Administered 2018-02-25 (×2): via INTRAVENOUS

## 2018-02-25 MED ORDER — CHLORHEXIDINE GLUCONATE 4 % EX LIQD: 60.0000 mL | Freq: Once | CUTANEOUS | Status: DC

## 2018-02-25 MED ORDER — FENTANYL CITRATE (PF) 100 MCG/2ML IJ SOLN
50.0000 ug | INTRAMUSCULAR | Status: DC | PRN
  Administered 2018-02-25: 50 ug via INTRAVENOUS

## 2018-02-25 MED ORDER — MEPIVACAINE HCL 1.5 % IJ SOLN
INTRAMUSCULAR | Status: DC | PRN
  Administered 2018-02-25: 10 mL via PERINEURAL

## 2018-02-25 MED ORDER — EPHEDRINE SULFATE 50 MG/ML IJ SOLN
INTRAMUSCULAR | Status: AC
  Filled 2018-02-25: qty 2

## 2018-02-25 MED ORDER — EPHEDRINE SULFATE 50 MG/ML IJ SOLN
INTRAMUSCULAR | Status: DC | PRN
  Administered 2018-02-25: 10 mg via INTRAVENOUS

## 2018-02-25 MED ORDER — PROPOFOL 10 MG/ML IV BOLUS
INTRAVENOUS | Status: AC
  Filled 2018-02-25: qty 20

## 2018-02-25 SURGICAL SUPPLY — 73 items
BANDAGE ACE 3X5.8 VEL STRL LF (GAUZE/BANDAGES/DRESSINGS) ×3 IMPLANT
BIT DRILL 3.6X128 (BIT) ×2 IMPLANT
BIT DRILL 3.6X128MM (BIT) ×1
BIT DRILL 7/64X5 DISP (BIT) ×3 IMPLANT
BLADE MINI RND TIP GREEN BEAV (BLADE) ×3 IMPLANT
BLADE SURG 15 STRL LF DISP TIS (BLADE) ×2 IMPLANT
BLADE SURG 15 STRL SS (BLADE) ×4
BNDG ELASTIC 2X5.8 VLCR STR LF (GAUZE/BANDAGES/DRESSINGS) IMPLANT
BNDG ESMARK 4X9 LF (GAUZE/BANDAGES/DRESSINGS) ×3 IMPLANT
BNDG GAUZE ELAST 4 BULKY (GAUZE/BANDAGES/DRESSINGS) ×3 IMPLANT
CHLORAPREP W/TINT 26ML (MISCELLANEOUS) ×3 IMPLANT
CORD BIPOLAR FORCEPS 12FT (ELECTRODE) ×3 IMPLANT
COVER BACK TABLE 60X90IN (DRAPES) ×3 IMPLANT
COVER MAYO STAND STRL (DRAPES) ×3 IMPLANT
CUFF TOURNIQUET SINGLE 18IN (TOURNIQUET CUFF) ×3 IMPLANT
DECANTER SPIKE VIAL GLASS SM (MISCELLANEOUS) IMPLANT
DRAPE EXTREMITY T 121X128X90 (DRAPE) ×3 IMPLANT
DRAPE OEC MINIVIEW 54X84 (DRAPES) ×3 IMPLANT
DRAPE SURG 17X23 STRL (DRAPES) ×3 IMPLANT
DRSG PAD ABDOMINAL 8X10 ST (GAUZE/BANDAGES/DRESSINGS) IMPLANT
GAUZE SPONGE 4X4 12PLY STRL (GAUZE/BANDAGES/DRESSINGS) ×3 IMPLANT
GAUZE XEROFORM 1X8 LF (GAUZE/BANDAGES/DRESSINGS) ×3 IMPLANT
GLOVE BIO SURGEON STRL SZ 6.5 (GLOVE) ×2 IMPLANT
GLOVE BIO SURGEON STRL SZ7.5 (GLOVE) ×3 IMPLANT
GLOVE BIO SURGEONS STRL SZ 6.5 (GLOVE) ×1
GLOVE BIOGEL PI IND STRL 7.0 (GLOVE) ×2 IMPLANT
GLOVE BIOGEL PI IND STRL 8 (GLOVE) ×1 IMPLANT
GLOVE BIOGEL PI IND STRL 8.5 (GLOVE) ×1 IMPLANT
GLOVE BIOGEL PI INDICATOR 7.0 (GLOVE) ×4
GLOVE BIOGEL PI INDICATOR 8 (GLOVE) ×2
GLOVE BIOGEL PI INDICATOR 8.5 (GLOVE) ×2
GLOVE SURG ORTHO 8.0 STRL STRW (GLOVE) ×3 IMPLANT
GOWN STRL REUS W/ TWL LRG LVL3 (GOWN DISPOSABLE) ×1 IMPLANT
GOWN STRL REUS W/TWL LRG LVL3 (GOWN DISPOSABLE) ×2
GOWN STRL REUS W/TWL XL LVL3 (GOWN DISPOSABLE) ×6 IMPLANT
K-WIRE .035X4 (WIRE) IMPLANT
NDL SAFETY ECLIPSE 18X1.5 (NEEDLE) IMPLANT
NEEDLE HYPO 18GX1.5 SHARP (NEEDLE)
NEEDLE HYPO 22GX1.5 SAFETY (NEEDLE) IMPLANT
NEEDLE HYPO 25X1 1.5 SAFETY (NEEDLE) IMPLANT
NEEDLE KEITH (NEEDLE) IMPLANT
NS IRRIG 1000ML POUR BTL (IV SOLUTION) ×3 IMPLANT
PACK BASIN DAY SURGERY FS (CUSTOM PROCEDURE TRAY) ×3 IMPLANT
PAD CAST 3X4 CTTN HI CHSV (CAST SUPPLIES) IMPLANT
PAD CAST 4YDX4 CTTN HI CHSV (CAST SUPPLIES) IMPLANT
PADDING CAST ABS 4INX4YD NS (CAST SUPPLIES)
PADDING CAST ABS COTTON 4X4 ST (CAST SUPPLIES) IMPLANT
PADDING CAST COTTON 3X4 STRL (CAST SUPPLIES)
PADDING CAST COTTON 4X4 STRL (CAST SUPPLIES)
PASSER SUT SWANSON 36MM LOOP (INSTRUMENTS) IMPLANT
SLEEVE SCD COMPRESS KNEE MED (MISCELLANEOUS) ×3 IMPLANT
SLING ARM FOAM STRAP LRG (SOFTGOODS) ×3 IMPLANT
SPLINT FAST PLASTER 5X30 (CAST SUPPLIES)
SPLINT PLASTER CAST FAST 5X30 (CAST SUPPLIES) IMPLANT
SPLINT PLASTER CAST XFAST 4X15 (CAST SUPPLIES) IMPLANT
SPLINT PLASTER XTRA FAST SET 4 (CAST SUPPLIES)
STOCKINETTE 4X48 STRL (DRAPES) ×3 IMPLANT
SUT ETHIBOND 3-0 V-5 (SUTURE) ×3 IMPLANT
SUT ETHILON 3 0 PS 1 (SUTURE) IMPLANT
SUT ETHILON 4 0 PS 2 18 (SUTURE) ×3 IMPLANT
SUT FIBERWIRE 2-0 18 17.9 3/8 (SUTURE)
SUT MERSILENE 2.0 SH NDLE (SUTURE) IMPLANT
SUT MERSILENE 4 0 P 3 (SUTURE) IMPLANT
SUT SILK 4 0 PS 2 (SUTURE) IMPLANT
SUT STEEL 3 0 (SUTURE) ×3 IMPLANT
SUT VIC AB 0 SH 27 (SUTURE) IMPLANT
SUT VICRYL 4-0 PS2 18IN ABS (SUTURE) ×3 IMPLANT
SUTURE FIBERWR 2-0 18 17.9 3/8 (SUTURE) IMPLANT
SYR BULB 3OZ (MISCELLANEOUS) ×3 IMPLANT
SYR CONTROL 10ML LL (SYRINGE) IMPLANT
TOWEL OR 17X24 6PK STRL BLUE (TOWEL DISPOSABLE) ×6 IMPLANT
TOWEL OR NON WOVEN STRL DISP B (DISPOSABLE) ×3 IMPLANT
UNDERPAD 30X30 (UNDERPADS AND DIAPERS) ×3 IMPLANT

## 2018-02-25 NOTE — Brief Op Note (Signed)
02/25/2018  11:45 AM  PATIENT:  Eric [name]  72 y.o. male  PRE-OPERATIVE DIAGNOSIS:  LEFT THUMB CARPOMETACARPAL OSTEOARTHRITIS  POST-OPERATIVE DIAGNOSIS:  LEFT THUMB CARPOMETACARPAL OSTEOARTHRITIS  PROCEDURE:  Procedure(s): LEFT THUMB TRAPEZIECTOMY WITH SUSPENSIONPLASTY (Left)  SURGEON:  Surgeon(s) and Role:    * Leanora Cover, MD - Primary    * Daryll Brod, MD - Assisting  PHYSICIAN ASSISTANT:   ASSISTANTS: Daryll Brod, MD   ANESTHESIA:   regional and general  EBL:  3 mL   BLOOD ADMINISTERED:none  DRAINS: none   LOCAL MEDICATIONS USED:  NONE  SPECIMEN:  No Specimen  DISPOSITION OF SPECIMEN:  N/A  COUNTS:  YES  TOURNIQUET:   Total Tourniquet Time Documented: Upper Arm (Left) - 70 minutes Total: Upper Arm (Left) - 70 minutes   DICTATION: .Other Dictation: Dictation Number [id number]  PLAN OF CARE: Discharge to home after PACU  PATIENT DISPOSITION:  PACU - hemodynamically stable.

## 2018-02-25 NOTE — Op Note (Signed)
NAME: Eric Eric [name], Eric Eric [name]. MEDICAL RECORD [id number] ACCOUNT [id number] DATE OF BIRTH:1946/04/06 FACILITY: [facility] LOCATION: [facility] PHYSICIAN:Rainn Zupko Beola Cord, MD  OPERATIVE REPORT  DATE OF PROCEDURE:  02/25/2018  PREOPERATIVE DIAGNOSIS:  Left thumb carpometacarpal osteoarthritis.  POSTOPERATIVE DIAGNOSIS:  Left thumb carpometacarpal osteoarthritis.  PROCEDURE:  Left thumb trapeziectomy with suspensionplasty with abductor pollicis longus tendon transfer.  SURGEON:  Leanora Cover, MD  ASSISTANT:  Daryll Brod, MD  ANESTHESIA:  General with regional.  INTRAVENOUS FLUIDS:  Per anesthesia flow sheet.  ESTIMATED BLOOD LOSS:  Minimal.  COMPLICATIONS:  None.  SPECIMENS:  None.  TOURNIQUET TIME:  70 minutes.  DISPOSITION:  Stable to PACU.  INDICATIONS:  The patient is Eric [name] 72 year old male who has had pain in the base of his thumb.  He has tried nonoperative measures without lasting relief.  He wishes to have Eric [name] trapeziectomy with suspensionplasty.  Risks, benefits and alternatives of surgery  were discussed including the risk of blood loss, infection, damage to nerves, vessels, tendons, ligaments, bone, failure of surgery, need for additional surgery, complications with wound healing and continued pain.  He voiced understanding of these risks  and elected to proceed.    OPERATIVE COURSE:  After being identified preoperatively by myself, the patient agreed upon the procedure and site of the procedure.  Surgical site was marked.  Risks, benefits and alternatives of surgery were reviewed, and he wished to proceed.   Surgical consent was signed.  He was given IV Ancef as preoperative antibiotic prophylaxis.  He was transferred to the operating room and placed on the operating table in supine position, left upper extremity on armboard.  Eric [name] regional block had been  performed by anesthesia in preoperative holding.  General anesthesia was induced in the operating room.  Left upper extremity was  prepped and draped in normal sterile orthopedic fashion.  Eric [name] surgical pause was performed between the surgeons, anesthesia,  and operating room staff, and all were in agreement with the patient, procedure, and site of procedure.  Tourniquet on the proximal aspect of the extremity was inflated to 250 mmHg after exsanguination of limb with an Esmarch bandage.  Incision was made  at the glabrous hairy border of the thumb and carried down to subcutaneous tissues by spreading technique.  The interval between the APL and EPB tendons was created.  The radial artery was identified and protected throughout the case.  The capsule was  incised sharply and retracted.  The trapezium was identified.  It was freed of soft tissue attachments.  It was then removed piecemeal with Eric [name] rongeur.  Once it had been removed, Eric [name] slip of APL tendon was selected at its insertion to the bone.  The tendon  was placed under tension, and an incision was made at the more proximal aspect of the tendon in the forearm.  Eric [name] wire was then used in Eric [name] cheese grater-type method to harvest the tendon.  This was released proximally and brought into the wounds distally.   Eric [name] drill hole was made in the thumb metacarpal base, coming out from the ulnar side at the articular border.  An additional hole was made in the index finger metacarpal.  An incision was made on the dorsum of the hand to access the other side of this  hole.  The wounds were copiously irrigated with sterile saline.  The tendon was then passed through the thumb metacarpal and then through the index finger metacarpal and background dorsally underneath the soft tissues.  It was then tied to  itself.  The  tendon was very thick and had to be separated.  The secondary slip of the tendon was placed into the trapeziectomy site as an anchovy type.  This was also secured using an Ethibond suture.  The C-arm was used in AP and lateral projections to ensure  appropriate suspension, which was the case.   The capsule was then repaired using Eric [name] Vicryl suture.  The skin was closed in all 3 wounds with 4-0 nylon in Eric [name] horizontal mattress fashion.  The wounds were then dressed with sterile Xeroform, 4 x 4's, and  wrapped with Eric [name] Kerlix bandage.  Eric [name] thumb spica splint was placed and wrapped with Kerlix and Ace bandage.  Tourniquet was deflated at 70 minutes.  Fingertips were pink with brisk capillary refill after deflation of the tourniquet.  The operative drapes  were broken down.  The patient was awoken from anesthesia safely.  He was transferred back to the stretcher and taken to PACU in stable condition.  I will see him back in the office in 1 week for postoperative followup.  Norco 5/325 one to two p.o. q.6  hours p.r.n. pain, dispense #30.  LN/NUANCE  D:02/25/2018 T:02/25/2018 JOB:[id number]

## 2018-02-25 NOTE — Transfer of Care (Signed)
Immediate Anesthesia Transfer of Care Note  Patient: Eric [name]  Procedure(s) Performed: LEFT THUMB TRAPEZIECTOMY WITH SUSPENSIONPLASTY (Left Hand)  Patient Location: PACU  Anesthesia Type:GA combined with regional for post-op pain  Level of Consciousness: awake and patient cooperative  Airway & Oxygen Therapy: Patient Spontanous Breathing and Patient connected to face mask oxygen  Post-op Assessment: Report given to RN and Post -op Vital signs reviewed and stable  Post vital signs: Reviewed and stable  Last Vitals:  Vitals Value Taken Time  BP 140/84 02/25/2018 11:53 AM  Temp    Pulse 85 02/25/2018 11:54 AM  Resp 14 02/25/2018 11:54 AM  SpO2 99 % 02/25/2018 11:54 AM  Vitals shown include unvalidated device data.  Last Pain:  Vitals:   02/25/18 0945  TempSrc: Oral  PainSc: 0-No pain      Patients Stated Pain Goal: 0 (36/[id number])  Complications: No apparent anesthesia complications

## 2018-02-25 NOTE — Anesthesia Preprocedure Evaluation (Signed)
Anesthesia Evaluation  Patient identified by MRN, date of birth, ID band Patient awake    Reviewed: Allergy & Precautions, NPO status , Patient's Chart, lab work & pertinent test results  Airway Mallampati: II  TM Distance: >3 FB Neck ROM: Full    Dental  (+) Teeth Intact, Dental Advisory Given   Pulmonary neg pulmonary ROS,    Pulmonary exam normal breath sounds clear to auscultation       Cardiovascular Exercise Tolerance: Good Normal cardiovascular exam Rhythm:Regular Rate:Normal  HLD   Neuro/Psych negative neurological ROS     GI/Hepatic negative GI ROS, Neg liver ROS,   Endo/Other  negative endocrine ROS  Renal/GU negative Renal ROS     Musculoskeletal  (+) Arthritis , LEFT THUMB CARPOMETACARPAL OSTEOARTHRITIS   Abdominal   Peds  Hematology negative hematology ROS (+)   Anesthesia Other Findings Day of surgery medications reviewed with the patient.  Reproductive/Obstetrics                             Anesthesia Physical Anesthesia Plan  ASA: II  Anesthesia Plan: General   Post-op Pain Management:  Regional for Post-op pain   Induction: Intravenous  PONV Risk Score and Plan: 2 and Dexamethasone and Ondansetron  Airway Management Planned: LMA  Additional Equipment:   Intra-op Plan:   Post-operative Plan: Extubation in OR  Informed Consent: I have reviewed the patients History and Physical, chart, labs and discussed the procedure including the risks, benefits and alternatives for the proposed anesthesia with the patient or authorized representative who has indicated his/her understanding and acceptance.   Dental advisory given  Plan Discussed with: CRNA  Anesthesia Plan Comments:         Anesthesia Quick Evaluation

## 2018-02-25 NOTE — H&P (Signed)
  Eric [name] is an 72 y.o. male.   Chief Complaint: left thumb cmc arthritis HPI: 72 yo male with left thumb pain at cmc joint.  Has tried non operative measures without lasting relief.  He wishes to undergo trapeziectomy with suspensionplasty.    Allergies: No Known Allergies  Past Medical History:  Diagnosis Date  . Chest pain   . Hypercholesterolemia   . Osteoarthritis   . Pneumonia    right middle lobe necrotizing pneumonia with abcess as well ass effusion and empyena and decortication    Past Surgical History:  Procedure Laterality Date  . CARDIAC CATHETERIZATION     2005  . LUMBAR DISC SURGERY      Family History: Family History  Problem Relation Age of Onset  . Heart disease Father     Social History:   reports that he has never smoked. He has never used smokeless tobacco. He reports that he drinks alcohol. He reports that he does not use drugs.  Medications: Medications Prior to Admission  Medication Sig Dispense Refill  . aspirin 81 MG tablet Take 81 mg by mouth daily.    Marland Kitchen atorvastatin (LIPITOR) 10 MG tablet Take 10 mg by mouth daily.    Marland Kitchen glucosamine-chondroitin 500-400 MG tablet Take 0.5 tablets by mouth 3 (three) times daily.      No results found for this or any previous visit (from the past 48 hour(s)).  No results found.   A comprehensive review of systems was negative.  Blood pressure (!) 142/83, pulse 66, temperature 97.9 F (36.6 C), temperature source Oral, resp. rate 18, height 6\' 1"  (1.854 m), weight 83 kg (183 lb), SpO2 98 %.  General appearance: alert, cooperative and appears stated age Head: Normocephalic, without obvious abnormality, atraumatic Neck: supple, symmetrical, trachea midline Cardio: regular rate and rhythm Resp: clear to auscultation bilaterally Extremities: Intact sensation and capillary refill all digits.  +epl/fpl/io.  No wounds.  Pulses: 2+ and symmetric Skin: Skin color, texture, turgor normal. No rashes or  lesions Neurologic: Grossly normal Incision/Wound: none  Assessment/Plan Left thumb cmc arthritis.  Non operative and operative treatment options were discussed with the patient and patient wishes to proceed with operative treatment. Risks, benefits, and alternatives of surgery were discussed and the patient agrees with the plan of care.   Brienne Liguori R 02/25/2018, 10:04 AM

## 2018-02-25 NOTE — Discharge Instructions (Addendum)

## 2018-02-25 NOTE — Anesthesia Procedure Notes (Signed)
Procedure Name: LMA Insertion Date/Time: 02/25/2018 10:27 AM Performed by: Marrianne Mood, CRNA Pre-anesthesia Checklist: Patient identified, Emergency Drugs available, Suction available and Patient being monitored Patient Re-evaluated:Patient Re-evaluated prior to induction Oxygen Delivery Method: Circle system utilized Preoxygenation: Pre-oxygenation with 100% oxygen Induction Type: IV induction Ventilation: Mask ventilation without difficulty LMA: LMA inserted LMA Size: 5.0 Number of attempts: 1 Airway Equipment and Method: Bite block Placement Confirmation: positive ETCO2 Tube secured with: Tape Dental Injury: Teeth and Oropharynx as per pre-operative assessment

## 2018-02-25 NOTE — Op Note (Signed)
I assisted Surgeon(s) and Role:    * Leanora Cover, MD - Primary    Daryll Brod, MD - Assisting on the Procedure(s): LEFT THUMB TRAPEZIECTOMY WITH SUSPENSIONPLASTY on 02/25/2018.  I provided assistance on this case as follows: Set up approach removal of the trapezium, harvesting the abductor pollicis longus tendon graft placement of drill holes of the metacarpals transfer of the APL tendon fixation of the tendon graft closure of the multiple wounds application of the dressings and splint.  Electronically signed by: Wynonia Sours, MD Date: 02/25/2018 Time: 11:46 AM

## 2018-02-25 NOTE — Op Note (Signed)
[id number] 

## 2018-02-25 NOTE — Progress Notes (Signed)
Assisted Dr. Turk with left, ultrasound guided, supraclavicular block. Side rails up, monitors on throughout procedure. See vital signs in flow sheet. Tolerated Procedure well. 

## 2018-02-25 NOTE — Anesthesia Procedure Notes (Signed)
Anesthesia Regional Block: Supraclavicular block   Pre-Anesthetic Checklist: ,, timeout performed, Correct Patient, Correct Site, Correct Laterality, Correct Procedure, Correct Position, site marked, Risks and benefits discussed,  Surgical consent,  Pre-op evaluation,  At surgeon's request and post-op pain management  Laterality: Left  Prep: chloraprep       Needles:  Injection technique: Single-shot  Needle Type: Echogenic Needle     Needle Length: 9cm  Needle Gauge: 21     Additional Needles:   Procedures:,,,, ultrasound used (permanent image in chart),,,,  Narrative:  Start time: 02/25/2018 9:55 AM End time: 02/25/2018 10:00 AM Injection made incrementally with aspirations every 5 mL.  Performed by: Personally  Anesthesiologist: Catalina Gravel, MD  Additional Notes: No pain on injection. No increased resistance to injection. Injection made in 5cc increments.  Good needle visualization.  Patient tolerated procedure well.

## 2018-02-26 ENCOUNTER — Encounter (HOSPITAL_BASED_OUTPATIENT_CLINIC_OR_DEPARTMENT_OTHER): Payer: Self-pay | Admitting: Orthopedic Surgery

## 2018-02-26 NOTE — Anesthesia Postprocedure Evaluation (Signed)
Anesthesia Post Note  Patient: Eric [name]  Procedure(s) Performed: LEFT THUMB TRAPEZIECTOMY WITH SUSPENSIONPLASTY (Left Hand)     Patient location during evaluation: PACU Anesthesia Type: General Level of consciousness: awake and alert Pain management: pain level controlled Vital Signs Assessment: post-procedure vital signs reviewed and stable Respiratory status: spontaneous breathing, nonlabored ventilation, respiratory function stable and patient connected to nasal cannula oxygen Cardiovascular status: blood pressure returned to baseline and stable Postop Assessment: no apparent nausea or vomiting Anesthetic complications: no    Last Vitals:  Vitals:   02/25/18 1200 02/25/18 1320  BP: (!) 146/86 (!) 145/83  Pulse: 85 77  Resp: 15 16  Temp:  36.5 C  SpO2: 97% 94%    Last Pain:  Vitals:   02/26/18 0934  TempSrc:   PainSc: 0-No pain                 Catalina Gravel

## 2018-03-02 DIAGNOSIS — M25542 Pain in joints of left hand: Secondary | ICD-10-CM | POA: Diagnosis not present

## 2018-03-02 DIAGNOSIS — M25642 Stiffness of left hand, not elsewhere classified: Secondary | ICD-10-CM | POA: Diagnosis not present

## 2018-03-02 DIAGNOSIS — M1812 Unilateral primary osteoarthritis of first carpometacarpal joint, left hand: Secondary | ICD-10-CM | POA: Diagnosis not present

## 2018-03-24 DIAGNOSIS — M1812 Unilateral primary osteoarthritis of first carpometacarpal joint, left hand: Secondary | ICD-10-CM | POA: Diagnosis not present

## 2018-03-26 DIAGNOSIS — M545 Low back pain: Secondary | ICD-10-CM | POA: Diagnosis not present

## 2018-03-26 DIAGNOSIS — M4326 Fusion of spine, lumbar region: Secondary | ICD-10-CM | POA: Diagnosis not present

## 2018-04-01 DIAGNOSIS — M1812 Unilateral primary osteoarthritis of first carpometacarpal joint, left hand: Secondary | ICD-10-CM | POA: Diagnosis not present

## 2018-04-01 DIAGNOSIS — M25642 Stiffness of left hand, not elsewhere classified: Secondary | ICD-10-CM | POA: Diagnosis not present

## 2018-04-01 DIAGNOSIS — M25542 Pain in joints of left hand: Secondary | ICD-10-CM | POA: Diagnosis not present

## 2018-04-07 DIAGNOSIS — M1812 Unilateral primary osteoarthritis of first carpometacarpal joint, left hand: Secondary | ICD-10-CM | POA: Diagnosis not present

## 2018-04-07 DIAGNOSIS — M25642 Stiffness of left hand, not elsewhere classified: Secondary | ICD-10-CM | POA: Diagnosis not present

## 2018-04-07 DIAGNOSIS — M25542 Pain in joints of left hand: Secondary | ICD-10-CM | POA: Diagnosis not present

## 2018-04-13 DIAGNOSIS — M25642 Stiffness of left hand, not elsewhere classified: Secondary | ICD-10-CM | POA: Diagnosis not present

## 2018-04-13 DIAGNOSIS — M25542 Pain in joints of left hand: Secondary | ICD-10-CM | POA: Diagnosis not present

## 2018-04-21 DIAGNOSIS — M180 Bilateral primary osteoarthritis of first carpometacarpal joints: Secondary | ICD-10-CM | POA: Diagnosis not present

## 2018-04-21 DIAGNOSIS — M79632 Pain in left forearm: Secondary | ICD-10-CM | POA: Diagnosis not present

## 2018-04-21 DIAGNOSIS — M25642 Stiffness of left hand, not elsewhere classified: Secondary | ICD-10-CM | POA: Diagnosis not present

## 2018-04-28 DIAGNOSIS — M545 Low back pain: Secondary | ICD-10-CM | POA: Diagnosis not present

## 2018-04-28 DIAGNOSIS — G894 Chronic pain syndrome: Secondary | ICD-10-CM | POA: Diagnosis not present

## 2018-04-28 DIAGNOSIS — M6281 Muscle weakness (generalized): Secondary | ICD-10-CM | POA: Diagnosis not present

## 2018-04-29 DIAGNOSIS — M25542 Pain in joints of left hand: Secondary | ICD-10-CM | POA: Diagnosis not present

## 2018-04-29 DIAGNOSIS — M25642 Stiffness of left hand, not elsewhere classified: Secondary | ICD-10-CM | POA: Diagnosis not present

## 2018-04-29 DIAGNOSIS — M79632 Pain in left forearm: Secondary | ICD-10-CM | POA: Diagnosis not present

## 2018-04-29 DIAGNOSIS — M180 Bilateral primary osteoarthritis of first carpometacarpal joints: Secondary | ICD-10-CM | POA: Diagnosis not present

## 2018-05-11 DIAGNOSIS — G894 Chronic pain syndrome: Secondary | ICD-10-CM | POA: Diagnosis not present

## 2018-05-11 DIAGNOSIS — M6281 Muscle weakness (generalized): Secondary | ICD-10-CM | POA: Diagnosis not present

## 2018-05-11 DIAGNOSIS — M545 Low back pain: Secondary | ICD-10-CM | POA: Diagnosis not present

## 2018-05-12 DIAGNOSIS — M1812 Unilateral primary osteoarthritis of first carpometacarpal joint, left hand: Secondary | ICD-10-CM | POA: Diagnosis not present

## 2018-05-12 DIAGNOSIS — M180 Bilateral primary osteoarthritis of first carpometacarpal joints: Secondary | ICD-10-CM | POA: Diagnosis not present

## 2018-05-12 DIAGNOSIS — M79632 Pain in left forearm: Secondary | ICD-10-CM | POA: Diagnosis not present

## 2018-05-12 DIAGNOSIS — M25542 Pain in joints of left hand: Secondary | ICD-10-CM | POA: Diagnosis not present

## 2018-05-12 DIAGNOSIS — M25642 Stiffness of left hand, not elsewhere classified: Secondary | ICD-10-CM | POA: Diagnosis not present

## 2018-05-18 DIAGNOSIS — M545 Low back pain: Secondary | ICD-10-CM | POA: Diagnosis not present

## 2018-05-18 DIAGNOSIS — M6281 Muscle weakness (generalized): Secondary | ICD-10-CM | POA: Diagnosis not present

## 2018-05-18 DIAGNOSIS — G894 Chronic pain syndrome: Secondary | ICD-10-CM | POA: Diagnosis not present

## 2018-05-21 DIAGNOSIS — M1812 Unilateral primary osteoarthritis of first carpometacarpal joint, left hand: Secondary | ICD-10-CM | POA: Diagnosis not present

## 2018-05-27 DIAGNOSIS — M545 Low back pain: Secondary | ICD-10-CM | POA: Diagnosis not present

## 2018-05-27 DIAGNOSIS — M6281 Muscle weakness (generalized): Secondary | ICD-10-CM | POA: Diagnosis not present

## 2018-05-27 DIAGNOSIS — G894 Chronic pain syndrome: Secondary | ICD-10-CM | POA: Diagnosis not present

## 2018-06-01 DIAGNOSIS — M6281 Muscle weakness (generalized): Secondary | ICD-10-CM | POA: Diagnosis not present

## 2018-06-01 DIAGNOSIS — M545 Low back pain: Secondary | ICD-10-CM | POA: Diagnosis not present

## 2018-06-01 DIAGNOSIS — G894 Chronic pain syndrome: Secondary | ICD-10-CM | POA: Diagnosis not present

## 2018-06-17 DIAGNOSIS — J309 Allergic rhinitis, unspecified: Secondary | ICD-10-CM | POA: Diagnosis not present

## 2018-06-17 DIAGNOSIS — K219 Gastro-esophageal reflux disease without esophagitis: Secondary | ICD-10-CM | POA: Diagnosis not present

## 2018-06-17 DIAGNOSIS — R05 Cough: Secondary | ICD-10-CM | POA: Diagnosis not present

## 2018-06-17 DIAGNOSIS — Z23 Encounter for immunization: Secondary | ICD-10-CM | POA: Diagnosis not present

## 2018-07-14 DIAGNOSIS — Z1211 Encounter for screening for malignant neoplasm of colon: Secondary | ICD-10-CM | POA: Diagnosis not present

## 2018-07-14 DIAGNOSIS — K635 Polyp of colon: Secondary | ICD-10-CM | POA: Diagnosis not present

## 2018-07-16 DIAGNOSIS — K635 Polyp of colon: Secondary | ICD-10-CM | POA: Diagnosis not present

## 2018-07-27 DIAGNOSIS — R05 Cough: Secondary | ICD-10-CM | POA: Diagnosis not present

## 2018-07-27 DIAGNOSIS — R040 Epistaxis: Secondary | ICD-10-CM | POA: Diagnosis not present

## 2018-07-27 DIAGNOSIS — J342 Deviated nasal septum: Secondary | ICD-10-CM | POA: Diagnosis not present

## 2018-09-24 DIAGNOSIS — M545 Low back pain: Secondary | ICD-10-CM | POA: Diagnosis not present

## 2018-09-24 DIAGNOSIS — M4326 Fusion of spine, lumbar region: Secondary | ICD-10-CM | POA: Diagnosis not present

## 2018-09-24 DIAGNOSIS — Z6823 Body mass index (BMI) 23.0-23.9, adult: Secondary | ICD-10-CM | POA: Diagnosis not present

## 2018-10-21 DIAGNOSIS — M4326 Fusion of spine, lumbar region: Secondary | ICD-10-CM | POA: Diagnosis not present

## 2018-10-21 DIAGNOSIS — M47816 Spondylosis without myelopathy or radiculopathy, lumbar region: Secondary | ICD-10-CM | POA: Diagnosis not present

## 2018-11-05 DIAGNOSIS — Z125 Encounter for screening for malignant neoplasm of prostate: Secondary | ICD-10-CM | POA: Diagnosis not present

## 2018-11-05 DIAGNOSIS — Z1389 Encounter for screening for other disorder: Secondary | ICD-10-CM | POA: Diagnosis not present

## 2018-11-05 DIAGNOSIS — Z0000 Encounter for general adult medical examination without abnormal findings: Secondary | ICD-10-CM | POA: Diagnosis not present

## 2018-11-05 DIAGNOSIS — I700 Atherosclerosis of aorta: Secondary | ICD-10-CM | POA: Diagnosis not present

## 2018-11-05 DIAGNOSIS — K219 Gastro-esophageal reflux disease without esophagitis: Secondary | ICD-10-CM | POA: Diagnosis not present

## 2018-11-05 DIAGNOSIS — Z79899 Other long term (current) drug therapy: Secondary | ICD-10-CM | POA: Diagnosis not present

## 2018-11-05 DIAGNOSIS — E7800 Pure hypercholesterolemia, unspecified: Secondary | ICD-10-CM | POA: Diagnosis not present

## 2018-11-30 ENCOUNTER — Encounter: Payer: Self-pay | Admitting: Internal Medicine

## 2018-11-30 ENCOUNTER — Ambulatory Visit (INDEPENDENT_AMBULATORY_CARE_PROVIDER_SITE_OTHER)
Admission: RE | Admit: 2018-11-30 | Discharge: 2018-11-30 | Disposition: A | Discharge status: Home / Self Care | Payer: Medicare Other | Source: Ambulatory Visit | Attending: Internal Medicine | Admitting: Internal Medicine

## 2018-11-30 ENCOUNTER — Ambulatory Visit (INDEPENDENT_AMBULATORY_CARE_PROVIDER_SITE_OTHER): Payer: Medicare Other | Admitting: Internal Medicine

## 2018-11-30 VITALS — BP 104/68 | HR 69 | Ht 73.0 in | Wt 185.6 lb

## 2018-11-30 DIAGNOSIS — R058 Other specified cough: Secondary | ICD-10-CM | POA: Insufficient documentation

## 2018-11-30 DIAGNOSIS — R05 Cough: Secondary | ICD-10-CM

## 2018-11-30 LAB — NITRIC OXIDE: NITRIC OXIDE: 13

## 2018-11-30 MED ORDER — PANTOPRAZOLE SODIUM 40 MG PO TBEC
40.0000 mg | DELAYED_RELEASE_TABLET | Freq: Every day | ORAL | 2 refills | Status: DC
Start: 2018-11-30 — End: 2019-01-26

## 2018-11-30 MED ORDER — PREDNISONE 10 MG PO TABS: ORAL_TABLET | ORAL | 0 refills | Status: DC

## 2018-11-30 MED ORDER — BENZONATATE 200 MG PO CAPS: 200.0000 mg | ORAL_CAPSULE | Freq: Three times a day (TID) | ORAL | 1 refills | Status: DC | PRN

## 2018-11-30 NOTE — Progress Notes (Signed)
Eric [name], male    DOB: [birth year],    MRN: [id number]   Brief patient profile:  89 yowm never smoker played college bball for [employer] with severe pna/pleurisy  around 2010 admitted to [facility] complicated by empyema on R then 100% back to nl then  2018 pattern of  Daily chronic  cough with sensation of excess pnds x doesn't bother him at hs so referred to pulmonary clinic 11/30/2018 by Dr   Eric [name]      History of Present Illness  11/30/2018  Pulmonary/ 1st office eval/Eric [name]  Chief Complaint  Patient presents with  . Pulmonary Consult    Referred by Dr. Lajean [name]. Pt c/o cough x 2 yrs, worse over the past year. His cough is worse with rest and is occ prod with min sputum-? color.   Dyspnea:  Not limited by breathing from desired activities , limited more by the back Cough: min mucus, clear assoc with overt HB Sleep: fine typically on side/ flat bed one pillow SABA use: none otc allergy rotates as "they all stop working [employer]"  and on nasal spray  No allergy or ent w/u to date codeine helped some but constipating/ does not remember being on prednisone    Eric [name] Reflux v Neurogenic Cough Differentiator Reflux Comments  Do you awaken from a sound sleep coughing violently?                            With trouble breathing? Rarely   Do you have choking episodes when you cannot  Get enough air, gasping for air ?              rarely   Do you usually cough when you lie down into  The bed, or when you just lie down to rest ?                          somewhat   Do you usually cough after meals or eating?         no   Do you cough when (or after) you bend over?    No    GERD SCORE     Eric [name] Reflux v Neurogenic Cough Differentiator Neurogenic   Do you more-or-less cough all day long? sporacdic when still    Does change of temperature make you cough? no   Does laughing or chuckling cause you to cough? maybe   Do fumes (perfume, automobile fumes, burned  Toast,  etc.,) cause you to cough ?      Yes    Does speaking, singing, or talking on the phone cause you to cough   ?               No    Neurogenic/Airway score       No obvious day to day or daytime variability or assoc excess/ purulent sputum or mucus plugs or hemoptysis or cp or chest tightness, subjective wheeze.     Sleeping most nights  without nocturnal  or early am exacerbation  of respiratory  c/o's or need for noct saba. Also denies any obvious fluctuation of symptoms with weather or environmental changes or other aggravating or alleviating factors except as outlined above   No unusual exposure hx or h/o childhood pna/ asthma or knowledge of premature birth.  Current Allergies, Complete Past Medical History, Past Surgical History, Family History, and Social History were  reviewed in [place] record.  ROS  The following are not active complaints unless bolded Hoarseness, sore throat, dysphagia, dental problems, itching, sneezing,  nasal congestion or discharge of excess mucus or purulent secretions, ear ache, fever, chills, sweats, unintended wt loss or wt gain, classically pleuritic or exertional cp,  orthopnea pnd or arm/hand swelling  or leg swelling, presyncope, palpitations, abdominal pain, anorexia, nausea, vomiting, diarrhea  or change in bowel habits or change in bladder habits, change in stools or change in urine, dysuria, hematuria,  rash, arthralgias, visual complaints, headache, numbness, weakness or ataxia or problems with walking or coordination,  change in mood or  memory.            Past Medical History:  Diagnosis Date  . Chest pain   . Hypercholesterolemia   . Osteoarthritis   . Pneumonia    right middle lobe necrotizing pneumonia with abcess as well ass effusion and empyena and decortication    Outpatient Medications Prior to Visit  Medication Sig Dispense Refill  . atorvastatin (LIPITOR) 10 MG tablet Take 10 mg by mouth daily.    .  famotidine (PEPCID) 20 MG tablet Take 20 mg by mouth daily.    Marland Kitchen glucosamine-chondroitin 500-400 MG tablet Take 0.5 tablets by mouth 3 (three) times daily.    Marland Kitchen aspirin 81 MG tablet Take 81 mg by mouth daily.    Marland Kitchen HYDROcodone-acetaminophen (NORCO) 5-325 MG tablet 1-2 tabs po q6 hours prn pain 30 tablet 0      Objective:     BP 104/68 (BP Location: Left Arm, Cuff Size: Normal)   Pulse 69   Ht 6\' 1"  (1.854 m)   Wt 185 lb 9.6 oz (84.2 kg)   SpO2 95%   BMI 24.49 kg/m   SpO2: 95 %  RA  Somber tall relatively  thin wm nad   HEENT: nl dentition, turbinates bilaterally, and oropharynx. Nl external ear canals without cough reflex   NECK :  without JVD/Nodes/TM/ nl carotid upstrokes bilaterally   LUNGS: no acc muscle use,  Nl contour chest which is clear to A and P bilaterally without cough on insp or exp maneuvers   CV:  RRR  no s3 or murmur or increase in P2, and no edema   ABD:  soft and nontender with nl inspiratory excursion in the supine position. No bruits or organomegaly appreciated, bowel sounds nl  MS:  Nl gait/ ext warm without deformities, calf tenderness, cyanosis or clubbing No obvious joint restrictions   SKIN: warm and dry without lesions    NEURO:  alert, approp, nl sensorium with  no motor or cerebellar deficits apparent.   CXR PA and Lateral:   11/30/2018 :    I personally reviewed images and   impression as follows:   RML atx/ scarring from prev surgery no change since 02/12/17      Labs ordered 11/30/2018  Allergy profile       Assessment   Upper airway cough syndrome Onset 2018 - FENO 11/30/2018  =   13  - Allergy profile 11/30/2018 >  Eos 0. /  IgE   - cyclical cough rx with 1st gen H1 blockers per guidelines    The most common causes of chronic cough in immunocompetent adults include the following: upper airway cough syndrome (UACS), previously referred to as postnasal drip syndrome (PNDS), which is caused by variety of rhinosinus conditions; (2)  asthma; (3) GERD; (4) chronic bronchitis from cigarette smoking or other  inhaled environmental irritants; (5) nonasthmatic eosinophilic bronchitis; and (6) bronchiectasis.   These conditions, singly or in combination, have accounted for up to 94% of the causes of chronic cough in prospective studies.   Other conditions have constituted no >6% of the causes in prospective studies These have included bronchogenic carcinoma, chronic interstitial pneumonia, sarcoidosis, left ventricular failure, ACEI-induced cough, and aspiration from a condition associated with pharyngeal dysfunction.    Chronic cough is often simultaneously caused by more than one condition. A single cause has been found from 38 to 82% of the time, multiple causes from 18 to 62%. Multiply caused cough has been the result of three diseases up to 42% of the time.       >>>>   Most likely this is Upper airway cough syndrome (previously labeled PNDS),  is so named because it's frequently impossible to sort out how much is  CR/sinusitis with freq throat clearing (which can be related to primary GERD)   vs  causing  secondary (" extra esophageal")  GERD from wide swings in gastric pressure that occur with throat clearing, often  promoting self use of mint and menthol lozenges that reduce the lower esophageal sphincter tone and exacerbate the problem further in a cyclical fashion.   These are the same pts (now being labeled as having "irritable larynx syndrome" by some cough centers) who not infrequently have a history of having failed to tolerate ace inhibitors,  dry powder inhalers or biphosphonates or report having atypical/extraesophageal reflux symptoms that don't respond to standard doses of PPI  and are easily confused as having aecopd or asthma flares by even experienced allergists/ pulmonologists (myself included).   Of the three most common causes of  Sub-acute / recurrent or chronic cough, only one (GERD)  can actually contribute to/  trigger  the other two (asthma and post nasal drip syndrome)  and perpetuate the cylce of cough.  While not intuitively obvious, many patients with chronic low grade reflux do not cough until there is a primary insult that disturbs the protective epithelial barrier and exposes sensitive nerve endings.   This is typically viral but can due to PNDS or GERD related  and  either of the latter two may apply here.  >>>>    The point is that once this occurs, it is difficult to eliminate the cycle  using anything but a maximally effective acid suppression regimen at least in the short run, accompanied by an appropriate diet to address non acid GERD, control  pnds with 1st gen H1 blockers per guidelines  And  and eliminate   the cough itself if possible with tessalon 200 up to q 6 h and if not eliminated then low threshold to add gabapentin for irritable larynx syndrome empirically at next ov.     >>>  Also advised: The standardized cough guidelines published in Chest by Lissa Morales in 2006 are still the best available and consist of a multiple step process (up to 12!) , not a single office visit,  and are intended  to address this problem logically,  with an alogrithm dependent on response to empiric treatment at  each progressive step  to determine a specific diagnosis with  minimal addtional testing needed. Therefore if adherence is an issue or can't be accurately verified,  it's very unlikely the standard evaluation and treatment will be successful here.    Furthermore, response to therapy (other than acute cough suppression, which should only be used short term with  avoidance of narcotic containing cough syrups if possible), can be a gradual process for which the patient is not likely to  perceive immediate benefit.  Unlike going to an eye doctor where the best perscription is almost always the first one and is immediately effective, this is almost never the case in the management of chronic cough  syndromes. Therefore the patient needs to commit up front to consistently adhere to recommendations  for up to 6 weeks of therapy directed at the likely underlying problem(s) before the response can be reasonably evaluated.       Total time devoted to counseling  > 50 % of initial 60 min office visit:  review case with pt/creation of the "cycle of cough" diagram in black with interventions in red to help him understand the problem and solution and to improve adherence what might otherwise be a cumbersome protocol/  discussion of options/alternatives/ personally creating written customized instructions  in presence of pt  then going over those specific  Instructions directly with the pt including how to use all of the meds but in particular covering each new medication in detail and the difference between the maintenance= "automatic" meds and the prns using an action plan format for the latter (If this problem/symptom => do that organization reading Left to right).  Please see AVS from this visit for a full list of these instructions which I personally wrote for this pt and  are unique to this visit.      Christinia Gully, MD 11/30/2018

## 2018-11-30 NOTE — Patient Instructions (Signed)
Prednisone 10 mg take  4 each am x 2 days,   2 each am x 2 days,  1 each am x 2 days and stop   Pantoprazole (protonix) 40 mg   Take  30-60 min before first meal of the day and Pepcid (famotidine)  20 mg one after supper  until return to office - this is the best way to tell whether stomach acid is contributing to your problem.    GERD (REFLUX)  is an extremely common cause of respiratory symptoms just like yours , many times with no obvious heartburn at all.    It can be treated with medication, but also with lifestyle changes including elevation of the head of your bed (ideally with 6 -8inch blocks under the headboard of your bed),  Smoking cessation, avoidance of late meals, excessive alcohol, and avoid fatty foods, chocolate, peppermint, colas, red wine, and acidic juices such as orange juice.  NO MINT OR MENTHOL PRODUCTS SO NO COUGH DROPS  USE SUGARLESS CANDY INSTEAD (Jolley ranchers or Stover's or Life Savers) or even ice chips will also do - the key is to swallow to prevent all throat clearing. NO OIL BASED VITAMINS - use powdered substitutes.  Avoid fish oil when coughing.    Please remember to go to the lab and x-ray department   for your tests - we will call you with the results when they are available.    Please schedule a follow up office visit in 6 weeks, call sooner if needed

## 2018-12-01 ENCOUNTER — Encounter: Payer: Self-pay | Admitting: Internal Medicine

## 2018-12-01 ENCOUNTER — Telehealth: Payer: Self-pay | Admitting: Internal Medicine

## 2018-12-01 LAB — RESPIRATORY ALLERGY PROFILE REGION II ~~LOC~~
Allergen, Cedar tree, t12: <0.1 kU/L
Allergen, Comm Silver Birch, t9: <0.1 kU/L
Allergen, D pternoyssinus,d7: <0.1 kU/L
Allergen, Mouse Urine Protein, e78: <0.1 kU/L
Allergen, Oak,t7: <0.1 kU/L
Bermuda Grass: <0.1 kU/L
Box Elder IgE: <0.1 kU/L
CLASS: 0
CLASS: 0
CLASS: 0
CLASS: 0
CLASS: 0
CLASS: 0
CLASS: 0
CLASS: 0
CLASS: 0
CLASS: 1
CLASS: 3
Cat Dander: 7.98 kU/L — ABNORMAL HIGH
Class: 0
Class: 0
Class: 0
Class: 0
Class: 0
Class: 0
Class: 0
Class: 0
Class: 0
Class: 0
Class: 0
Class: 0
Class: 0
Cockroach: <0.1 kU/L
DOG DANDER: 0.39 kU/L — AB
Elm IgE: <0.1 kU/L
IGE (IMMUNOGLOBULIN E), SERUM: 47 kU/L (ref <=114)
Johnson Grass: <0.1 kU/L
Sheep Sorrel IgE: <0.1 kU/L
Timothy Grass: <0.1 kU/L

## 2018-12-01 LAB — CBC WITH DIFFERENTIAL/PLATELET
BASOS ABS: 0 10*3/uL (ref 0.0–0.1)
Basophils Relative: 0.2 % (ref 0.0–3.0)
EOS ABS: 0.2 10*3/uL (ref 0.0–0.7)
Eosinophils Relative: 2.2 % (ref 0.0–5.0)
HCT: 41.9 % (ref 39.0–52.0)
Hemoglobin: 14.1 g/dL (ref 13.0–17.0)
LYMPHS ABS: 3.1 10*3/uL (ref 0.7–4.0)
Lymphocytes Relative: 28.3 % (ref 12.0–46.0)
MCHC: 33.6 g/dL (ref 30.0–36.0)
MCV: 90.6 fl (ref 78.0–100.0)
MONO ABS: 0.8 10*3/uL (ref 0.1–1.0)
MONOS PCT: 7.1 % (ref 3.0–12.0)
NEUTROS ABS: 6.9 10*3/uL (ref 1.4–7.7)
Neutrophils Relative %: 62.2 % (ref 43.0–77.0)
PLATELETS: 164 10*3/uL (ref 150.0–400.0)
RBC: 4.63 Mil/uL (ref 4.22–5.81)
RDW: 13.4 % (ref 11.5–15.5)
WBC: 11 10*3/uL — ABNORMAL HIGH (ref 4.0–10.5)

## 2018-12-01 LAB — INTERPRETATION:

## 2018-12-01 NOTE — Telephone Encounter (Signed)
Patient was advised that Chlora-tab  Is the medication  And told where he could get it otc.Nothing further needed

## 2018-12-01 NOTE — Telephone Encounter (Signed)
Notes recorded by Tanda Rockers, MD on 12/01/2018 at 11:39 AM EST Call pt: Reviewed cxr and no acute change so no change in recommendations made at ov  Patient is aware nothing further needed.

## 2018-12-01 NOTE — Progress Notes (Signed)
LMTCB

## 2018-12-01 NOTE — Assessment & Plan Note (Addendum)
Onset 2018 - FENO 11/30/2018  =   13  - Allergy profile 11/30/2018 >  Eos 0. /  IgE   - cyclical cough rx with 1st gen H1 blockers per guidelines    The most common causes of chronic cough in immunocompetent adults include the following: upper airway cough syndrome (UACS), previously referred to as postnasal drip syndrome (PNDS), which is caused by variety of rhinosinus conditions; (2) asthma; (3) GERD; (4) chronic bronchitis from cigarette smoking or other inhaled environmental irritants; (5) nonasthmatic eosinophilic bronchitis; and (6) bronchiectasis.   These conditions, singly or in combination, have accounted for up to 94% of the causes of chronic cough in prospective studies.   Other conditions have constituted no >6% of the causes in prospective studies These have included bronchogenic carcinoma, chronic interstitial pneumonia, sarcoidosis, left ventricular failure, ACEI-induced cough, and aspiration from a condition associated with pharyngeal dysfunction.    Chronic cough is often simultaneously caused by more than one condition. A single cause has been found from 38 to 82% of the time, multiple causes from 18 to 62%. Multiply caused cough has been the result of three diseases up to 42% of the time.       >>>>   Most likely this is Upper airway cough syndrome (previously labeled PNDS),  is so named because it's frequently impossible to sort out how much is  CR/sinusitis with freq throat clearing (which can be related to primary GERD)   vs  causing  secondary (" extra esophageal")  GERD from wide swings in gastric pressure that occur with throat clearing, often  promoting self use of mint and menthol lozenges that reduce the lower esophageal sphincter tone and exacerbate the problem further in a cyclical fashion.   These are the same pts (now being labeled as having "irritable larynx syndrome" by some cough centers) who not infrequently have a history of having failed to tolerate ace  inhibitors,  dry powder inhalers or biphosphonates or report having atypical/extraesophageal reflux symptoms that don't respond to standard doses of PPI  and are easily confused as having aecopd or asthma flares by even experienced allergists/ pulmonologists (myself included).   Of the three most common causes of  Sub-acute / recurrent or chronic cough, only one (GERD)  can actually contribute to/ trigger  the other two (asthma and post nasal drip syndrome)  and perpetuate the cylce of cough.  While not intuitively obvious, many patients with chronic low grade reflux do not cough until there is a primary insult that disturbs the protective epithelial barrier and exposes sensitive nerve endings.   This is typically viral but can due to PNDS or GERD related  and  either of the latter two may apply here.  >>>>    The point is that once this occurs, it is difficult to eliminate the cycle  using anything but a maximally effective acid suppression regimen at least in the short run, accompanied by an appropriate diet to address non acid GERD, control  pnds with 1st gen H1 blockers per guidelines  And  and eliminate   the cough itself if possible with tessalon 200 up to q 6 h and if not eliminated then low threshold to add gabapentin for irritable larynx syndrome empirically at next ov.    >>> also advised:  The standardized cough guidelines published in Chest by Lissa Morales in 2006 are still the best available and consist of a multiple step process (up to 12!) , not a single  office visit,  and are intended  to address this problem logically,  with an alogrithm dependent on response to empiric treatment at  each progressive step  to determine a specific diagnosis with  minimal addtional testing needed. Therefore if adherence is an issue or can't be accurately verified,  it's very unlikely the standard evaluation and treatment will be successful here.    Furthermore, response to therapy (other than acute cough  suppression, which should only be used short term with avoidance of narcotic containing cough syrups if possible), can be a gradual process for which the patient is not likely to  perceive immediate benefit.  Unlike going to an eye doctor where the best perscription is almost always the first one and is immediately effective, this is almost never the case in the management of chronic cough syndromes. Therefore the patient needs to commit up front to consistently adhere to recommendations  for up to 6 weeks of therapy directed at the likely underlying problem(s) before the response can be reasonably evaluated.    Total time devoted to counseling  > 50 % of initial 60 min office visit:  review case with pt/creation of the "cycle of cough" diagram in black with interventions in red to help him understand the problem and solution and to improve adherence what might otherwise be a cumbersome protocol/  discussion of options/alternatives/ personally creating written customized instructions  in presence of pt  then going over those specific  Instructions directly with the pt including how to use all of the meds but in particular covering each new medication in detail and the difference between the maintenance= "automatic" meds and the prns using an action plan format for the latter (If this problem/symptom => do that organization reading Left to right).  Please see AVS from this visit for a full list of these instructions which I personally wrote for this pt and  are unique to this visit.

## 2018-12-02 NOTE — Progress Notes (Signed)
Spoke with pt and notified of results per Dr. Wert. Pt verbalized understanding and denied any questions. 

## 2018-12-09 DIAGNOSIS — R350 Frequency of micturition: Secondary | ICD-10-CM | POA: Diagnosis not present

## 2018-12-27 ENCOUNTER — Telehealth: Payer: Self-pay

## 2018-12-27 NOTE — Telephone Encounter (Signed)
Patient called office requesting to switch providers. He states a lady at his church recommended MR. He states after having seen MW he has not gotten better and would like to see MR. Made aware of office policy.   Call back # [phone]

## 2018-12-27 NOTE — Telephone Encounter (Signed)
Left message for patient to call back  

## 2018-12-27 NOTE — Telephone Encounter (Signed)
Pt is calling back. Per pt, he is requesting an apt with MW or MR. Cb is [phone].

## 2018-12-28 ENCOUNTER — Other Ambulatory Visit: Payer: Self-pay | Admitting: Internal Medicine

## 2018-12-28 DIAGNOSIS — R05 Cough: Secondary | ICD-10-CM

## 2018-12-28 DIAGNOSIS — R058 Other specified cough: Secondary | ICD-10-CM

## 2018-12-28 NOTE — Telephone Encounter (Signed)
Yes office visit in 3 months after covid subsides

## 2018-12-28 NOTE — Telephone Encounter (Signed)
Fine with me - let him know the process to get rid of a chronic cough by the world's best cough doctors is up to 12 steps, not one office visit, and regardless of whom he sees will need to regroup with all active meds in hand to be fair to the the next doctor as to what may be a prolonged process

## 2018-12-28 NOTE — Telephone Encounter (Signed)
MR, please advise if you are ok with taking him as a patient. Thanks!

## 2018-12-29 NOTE — Telephone Encounter (Signed)
LMTCB- when calls back please schedule 3 month 30 min with MR, thanks

## 2018-12-30 NOTE — Telephone Encounter (Signed)
Called and spoke with Patient.  Dr Chase Caller recommendations given. Patient stated at this time, he feels he needs to be seen before 3 months.  Patient stated he would keep OV 01/13/19, with Dr Melvyn Novas.  Nothing further at this time.

## 2019-01-10 ENCOUNTER — Other Ambulatory Visit: Payer: Self-pay | Admitting: Internal Medicine

## 2019-01-10 DIAGNOSIS — R058 Other specified cough: Secondary | ICD-10-CM

## 2019-01-10 DIAGNOSIS — R05 Cough: Secondary | ICD-10-CM

## 2019-01-12 ENCOUNTER — Institutional Professional Consult (permissible substitution): Payer: Medicare Other | Admitting: Internal Medicine

## 2019-01-13 ENCOUNTER — Ambulatory Visit: Payer: Medicare Other | Admitting: Internal Medicine

## 2019-01-18 ENCOUNTER — Telehealth: Payer: Self-pay | Admitting: Internal Medicine

## 2019-01-18 NOTE — Telephone Encounter (Signed)
Spoke with pt, I advised him that I did not know why someone called him from our office. It could have been a reminder that he has a televisit appointment with SG on 01/26/2019. Pt understood and nothing further is needed.

## 2019-01-26 ENCOUNTER — Ambulatory Visit (INDEPENDENT_AMBULATORY_CARE_PROVIDER_SITE_OTHER): Payer: Medicare Other | Admitting: Acute Care

## 2019-01-26 ENCOUNTER — Ambulatory Visit: Payer: Medicare Other | Admitting: Internal Medicine

## 2019-01-26 ENCOUNTER — Encounter: Payer: Self-pay | Admitting: Acute Care

## 2019-01-26 ENCOUNTER — Other Ambulatory Visit: Payer: Self-pay

## 2019-01-26 DIAGNOSIS — R058 Other specified cough: Secondary | ICD-10-CM

## 2019-01-26 DIAGNOSIS — J471 Bronchiectasis with (acute) exacerbation: Secondary | ICD-10-CM

## 2019-01-26 DIAGNOSIS — R05 Cough: Secondary | ICD-10-CM

## 2019-01-26 MED ORDER — FLUTTER DEVI: 0 refills | Status: DC

## 2019-01-26 MED ORDER — PANTOPRAZOLE SODIUM 40 MG PO TBEC: 40.0000 mg | DELAYED_RELEASE_TABLET | Freq: Every day | ORAL | 2 refills | Status: DC

## 2019-01-26 MED ORDER — BENZONATATE 200 MG PO CAPS: ORAL_CAPSULE | ORAL | 2 refills | Status: DC

## 2019-01-26 NOTE — Patient Instructions (Addendum)
It was nice to speak with you today. We will send refills for you Tessalon and Protonix. We will get a sputum specimen for culture  to make sure infection is not a component of your cough Continue cough regimen Delsym 2 teaspoons twice daily ,  Tessalon perles every 8 hours, At bedtime add  chlor tab 4 mg. It is important to stack these medications for them to work properly. There is a cumulative effect. Continue  Pantoprazole (protonix) 40 mg   Take  30-60 min before first meal of the day.  Continue Pepcid (famotidine)  20 mg one after supper   Start using a flutter valve , we will have one of the nurses instruct you on use. Please use it 3-4 times daily. Remember to wash it once weekly with  We will schedule an appointment in June 2020 with Dr. Melvyn Novas. We will schedule PFT's and HRCT chest without contrast in June prior to your appointment so Dr. Melvyn Novas can review the results with you.  Sips of water instead of throat clearing Follow GERD diet Please contact office for sooner follow up if symptoms do not improve or worsen or seek emergency care

## 2019-01-26 NOTE — Progress Notes (Signed)
Virtual Visit via Telephone Note  I connected with Eric [name] on 01/26/19 at  9:00 AM EDT by telephone and verified that I am speaking with the correct person using two identifiers.   I discussed the limitations, risks, security and privacy concerns of performing an evaluation and management service by telephone and the availability of in person appointments. I also discussed with the patient that there may be a patient responsible charge related to this service. The patient expressed understanding and agreed to proceed.  I  confirmed date of birth and address to authenticate patient  Identity. My nurse Quentin Ore reviewed medications and ordered any refills required.  Brief patient profile:  75 yowm never smoker played college bball for [employer] with severe pna/pleurisy  around 2010 admitted to [facility] complicated by empyema on R then 100% back to [facility] then  2018 pattern of  Daily chronic  cough with sensation of excess pnds x doesn't bother him at hs so referred to pulmonary clinic 11/30/2018 by Dr   Felipa Eth    History of Present Illness: Pt was seen by Dr. Melvyn Novas 11/30/2018 for upper airway cough. He was treated with a prednisone taper, protonix, pepcid, and Tessalon Perles and Chlor tabs. He states he has been compliant with all medications.He states his cough is no better. He states he did try the chlor tabs , but they were less effective than the anti histamine he was taking prior to trying the consultation. He has not continued taking them. He states the cough is worse when he is resting.Better when he was active. He stopped using the hard candies and resumed using  cough drops . He states he does better with the cough drops than the sugar free candy. The cough drops do not contain menthol or mint. He did note that he was better after prednisone. He states he is coughing up a small amount of sputum, but he did not note the color.He has had no fever. He is not wanting any additional  prednisone. He is frustrated he has not experienced any improvement. He does have allergy to cat dander and dog dander.We discussed making sure he washed his hands and face after touching his dog, and to change clothes if he found they were covered with dander.He denies fever, chest pain, orthopnea or hemoptysis.    Observations/Objective: Upper airway cough syndrome Onset 2018 - FENO 11/30/2018  =   13  - Allergy profile 11/30/2018 >  Eos 0. /  IgE   - cyclical cough rx with 1st gen H1 blockers per guidelines    CT Chest 02/2017 -Lungs/Pleura: Biapical pleuroparenchymal scarring. Scattered post infectious scarring and bronchiectasis in the right middle and left lower lobes. No pleural fluid. Airway is unremarkable.   Assessment and Plan: Upper Airway cough  No better after following regimen of Delsym 2 teaspoons twice daily ,  Tessalon perles every 8 hours, and At bedtime chlor tab 4 mg. Pantoprazole (protonix) 40 mg   Take  30-60 min before first meal of the day.  Pepcid (famotidine)  20 mg one after supper  Some improvement on prednisone Plan: Delsym 2 teaspoons twice daily ,  Tessalon perles every 8 hours, At bedtime add  chlor tab 4 mg. It is important to stack these medications for them to work properly. Continue  Pantoprazole (protonix) 40 mg   Take  30-60 min before first meal of the day.  Continue Pepcid (famotidine)  20 mg one after supper    Bronchiectasis  Per CT Chest  2015 Plan We will get a sputum specimen for culture  to make sure infection is not a component of your cough We will call you if this is abnormal , and we will treat with appropriate antibiotics if indicated. Flutter valve 3-4 times daily HRCT without contrast  in June PFT's in June Follow up with Dr. Melvyn Novas in June after CT and PFT's.    Covid 19 Continue to self isolate, wash hand frequently, and wear a face mask if you leave your home. Remember to utilize the early hours at grocery stores/ drug  stores  for people > 65 years old or any underlying health risks, as the stores are cleaner and less crowded at these times.   Follow Up Instructions: Follow up with Dr. Melvyn Novas in June 2020. Follow up PFT's and HRCT prior to seeing Dr. Melvyn Novas   I discussed the assessment and treatment plan with the patient. The patient was provided an opportunity to ask questions and all were answered. The patient agreed with the plan and demonstrated an understanding of the instructions.   The patient was advised to call back or seek an in-person evaluation if the symptoms worsen or if the condition fails to improve as anticipated.  I provided 50 minutes of non-face-to-face time during this encounter.   [name], NP 01/26/2019 .10:01 AM

## 2019-03-17 ENCOUNTER — Other Ambulatory Visit: Payer: Self-pay | Admitting: Geriatric Medicine

## 2019-03-17 DIAGNOSIS — F5101 Primary insomnia: Secondary | ICD-10-CM | POA: Diagnosis not present

## 2019-03-17 DIAGNOSIS — E7800 Pure hypercholesterolemia, unspecified: Secondary | ICD-10-CM | POA: Diagnosis not present

## 2019-03-17 DIAGNOSIS — R05 Cough: Secondary | ICD-10-CM | POA: Diagnosis not present

## 2019-03-17 DIAGNOSIS — R1310 Dysphagia, unspecified: Secondary | ICD-10-CM | POA: Diagnosis not present

## 2019-03-17 DIAGNOSIS — R1319 Other dysphagia: Secondary | ICD-10-CM

## 2019-03-21 ENCOUNTER — Ambulatory Visit
Admission: RE | Admit: 2019-03-21 | Discharge: 2019-03-21 | Disposition: A | Discharge status: Home / Self Care | Payer: Medicare Other | Source: Ambulatory Visit | Attending: Geriatric Medicine | Admitting: Geriatric Medicine

## 2019-03-21 DIAGNOSIS — K449 Diaphragmatic hernia without obstruction or gangrene: Secondary | ICD-10-CM | POA: Diagnosis not present

## 2019-03-21 DIAGNOSIS — R1319 Other dysphagia: Secondary | ICD-10-CM

## 2019-03-21 DIAGNOSIS — R1310 Dysphagia, unspecified: Secondary | ICD-10-CM

## 2019-03-29 ENCOUNTER — Other Ambulatory Visit: Payer: Self-pay | Admitting: Acute Care

## 2019-04-01 ENCOUNTER — Telehealth: Payer: Self-pay | Admitting: *Deleted

## 2019-04-01 NOTE — Telephone Encounter (Signed)

## 2019-04-04 ENCOUNTER — Other Ambulatory Visit: Payer: Self-pay

## 2019-04-04 ENCOUNTER — Ambulatory Visit (INDEPENDENT_AMBULATORY_CARE_PROVIDER_SITE_OTHER)
Admission: RE | Admit: 2019-04-04 | Discharge: 2019-04-04 | Disposition: A | Discharge status: Home / Self Care | Payer: Medicare Other | Source: Ambulatory Visit | Attending: Acute Care | Admitting: Acute Care

## 2019-04-04 ENCOUNTER — Other Ambulatory Visit (HOSPITAL_COMMUNITY)
Admission: RE | Admit: 2019-04-04 | Discharge: 2019-04-04 | Disposition: A | Discharge status: Home / Self Care | Payer: Medicare Other | Source: Ambulatory Visit | Attending: Acute Care | Admitting: Acute Care

## 2019-04-04 DIAGNOSIS — Z1159 Encounter for screening for other viral diseases: Secondary | ICD-10-CM | POA: Insufficient documentation

## 2019-04-04 DIAGNOSIS — J471 Bronchiectasis with (acute) exacerbation: Secondary | ICD-10-CM | POA: Diagnosis not present

## 2019-04-04 DIAGNOSIS — J479 Bronchiectasis, uncomplicated: Secondary | ICD-10-CM | POA: Diagnosis not present

## 2019-04-04 LAB — SARS CORONAVIRUS 2 (TAT 6-24 HRS): SARS Coronavirus 2: NEGATIVE

## 2019-04-07 ENCOUNTER — Ambulatory Visit: Payer: Medicare Other | Admitting: Internal Medicine

## 2019-04-07 ENCOUNTER — Other Ambulatory Visit: Payer: Self-pay

## 2019-04-07 ENCOUNTER — Encounter: Payer: Self-pay | Admitting: Nurse Practitioner

## 2019-04-07 ENCOUNTER — Ambulatory Visit (INDEPENDENT_AMBULATORY_CARE_PROVIDER_SITE_OTHER): Payer: Medicare Other | Admitting: Nurse Practitioner

## 2019-04-07 ENCOUNTER — Telehealth: Payer: Self-pay | Admitting: Nurse Practitioner

## 2019-04-07 ENCOUNTER — Ambulatory Visit: Payer: Medicare Other

## 2019-04-07 VITALS — BP 132/70 | HR 63 | Temp 98.0°F | Ht 74.0 in | Wt 177.6 lb

## 2019-04-07 DIAGNOSIS — J479 Bronchiectasis, uncomplicated: Secondary | ICD-10-CM

## 2019-04-07 DIAGNOSIS — R05 Cough: Secondary | ICD-10-CM | POA: Diagnosis not present

## 2019-04-07 DIAGNOSIS — J471 Bronchiectasis with (acute) exacerbation: Secondary | ICD-10-CM

## 2019-04-07 DIAGNOSIS — R058 Other specified cough: Secondary | ICD-10-CM

## 2019-04-07 MED ORDER — AZITHROMYCIN 250 MG PO TABS: ORAL_TABLET | ORAL | 0 refills | Status: DC

## 2019-04-07 MED ORDER — SODIUM CHLORIDE 3 % IN NEBU: INHALATION_SOLUTION | RESPIRATORY_TRACT | 12 refills | Status: DC | PRN

## 2019-04-07 NOTE — Patient Instructions (Addendum)
Will order azithromycin Use flutter valve 4-5 times daily Will need sputum samples Will order hypertonic saline nebulizer treatments  Follow up: Follow up with Dr. Melvyn Novas in 2-4 weeks    Bronchiectasis  Bronchiectasis is a condition in which the airways in the lungs (bronchi) are damaged and widened. The condition makes it hard for the lungs to get rid of mucus, and it causes mucus to gather in the bronchi. This condition often leads to lung infections, which can make the condition worse. What are the causes? You can be born with this condition or you can develop it later in life. Common causes of this condition include:  Cystic fibrosis.  Repeated lung infections, such as pneumonia or tuberculosis.  An object or other blockage in the lungs.  Breathing in fluid, food, or other objects (aspiration).  A problem with the immune system and lung structure that is present at birth (congenital). Sometimes the cause is not known. What are the signs or symptoms? Common symptoms of this condition include:  A daily cough that brings up mucus and lasts for more than 3 weeks.  Lung infections that happen often.  Shortness of breath and wheezing.  Weakness and fatigue. How is this diagnosed? This condition is diagnosed with tests, such as:  Chest X-rays or CT scans. These are done to check for changes in the lungs.  Breathing tests. These are done to check how well your lungs are working.  A test of a sample of your saliva (sputum culture). This test is done to check for infection.  Blood tests and other tests. These are done to check for related diseases or causes. How is this treated? Treatment for this condition depends on the severity of the illness and its cause. Treatment may include:  Medicines that loosen mucus so it can be coughed up (expectorants).  Medicines that relax the muscles of the bronchi (bronchodilators).  Antibiotic medicines to prevent or treat infection.   Physical therapy to help clear mucus from the lungs. Techniques may include: ? Postural drainage. This is when you sit or lie in certain positions so that mucus can drain by gravity. ? Chest percussion. This involves tapping the chest or back with a cupped hand. ? Chest vibration. For this therapy, a hand or special equipment vibrates your chest and back.  Surgery to remove the affected part of the lung. This may be done in severe cases. Follow these instructions at home: Medicines  Take over-the-counter and prescription medicines only as told by your health care provider.  If you were prescribed an antibiotic medicine, take it as told by your health care provider. Do not stop taking the antibiotic even if you start to feel better.  Avoid taking sedatives and antihistamines unless your health care provider tells you to take them. These medicines tend to thicken the mucus in the lungs. Managing symptoms  Perform breathing exercises or techniques to clear your lungs as told by your health care provider.  Consider using a cold steam vaporizer or humidifier in your room or home to help loosen secretions.  If you have a cough that gets worse at night, try sleeping in a semi-upright position. General instructions  Get plenty of rest.  Drink enough fluid to keep your urine clear or pale yellow.  Stay inside when pollution and ozone levels are high.  Stay up to date with vaccinations and immunizations.  Avoid cigarette smoke and other lung irritants.  Do not use any products that contain nicotine  or tobacco, such as cigarettes and e-cigarettes. If you need help quitting, ask your health care provider.  Keep all follow-up visits as told by your health care provider. This is important. Contact a health care provider if:  You cough up more sputum than before and the sputum is yellow or green in color.  You have a fever.  You cannot control your cough and are losing sleep. Get help  right away if:  You cough up blood.  You have chest pain.  You have increasing shortness of breath.  You have pain that gets worse or is not controlled with medicines.  You have a fever and your symptoms suddenly get worse. Summary  Bronchiectasis is a condition in which the airways in the lungs (bronchi) are damaged and widened. The condition makes it hard for the lungs to get rid of mucus, and it causes mucus to gather in the bronchi.  Treatment usually includes therapy to help clear mucus from the lungs.  Stay up to date with vaccinations and immunizations. This information is not intended to replace advice given to you by your health care provider. Make sure you discuss any questions you have with your health care provider. Document Released: 07/27/2007 Document Revised: 11/03/2016 Document Reviewed: 11/03/2016 Elsevier Interactive Patient Education  2019 [employer].

## 2019-04-07 NOTE — Telephone Encounter (Signed)
Called and spoke with pt who was wanting to know where the hypertonic nebulizer solution was going to be coming from. I stated to pt that after looking at [place] from today and at the orders that was placed, they are getting pt set up with a DME to provide him with a nebulizer as well as the hypertonic nebulizer solution.  [facility], is there any way you can help [place] figure out this for pt from where he is going to be receiving this? Thank you!

## 2019-04-07 NOTE — Assessment & Plan Note (Signed)
Patient presents today for a follow-up.  Was scheduled for PFT this morning but was not able to complete PFT.  Patient did have recent CT scan which showed bronchiectasis and atypical infection/questionable Mycobacterium.  At patient's last visit on 01/26/2019 with Sarah patient was ordered sputum samples, but unfortunately has not been able to produce a sample to bring in.  Patient continues have ongoing cough.  His cough is nonproductive.  Patient has followed previous recommendations from Dr. Melvyn Novas including Protonix and Pepcid and Ladona Ridgel.  States this has not improved his cough.  Patient denies any recent fever.   Patient Instructions  Will order azithromycin Use flutter valve 4-5 times daily Will need sputum samples Will order hypertonic saline nebulizer treatments  Follow up: Follow up with Dr. Melvyn Novas in 2-4 weeks    Bronchiectasis  Bronchiectasis is a condition in which the airways in the lungs (bronchi) are damaged and widened. The condition makes it hard for the lungs to get rid of mucus, and it causes mucus to gather in the bronchi. This condition often leads to lung infections, which can make the condition worse. What are the causes? You can be born with this condition or you can develop it later in life. Common causes of this condition include:  Cystic fibrosis.  Repeated lung infections, such as pneumonia or tuberculosis.  An object or other blockage in the lungs.  Breathing in fluid, food, or other objects (aspiration).  A problem with the immune system and lung structure that is present at birth (congenital). Sometimes the cause is not known. What are the signs or symptoms? Common symptoms of this condition include:  A daily cough that brings up mucus and lasts for more than 3 weeks.  Lung infections that happen often.  Shortness of breath and wheezing.  Weakness and fatigue. How is this diagnosed? This condition is diagnosed with tests, such as:  Chest  X-rays or CT scans. These are done to check for changes in the lungs.  Breathing tests. These are done to check how well your lungs are working.  A test of a sample of your saliva (sputum culture). This test is done to check for infection.  Blood tests and other tests. These are done to check for related diseases or causes. How is this treated? Treatment for this condition depends on the severity of the illness and its cause. Treatment may include:  Medicines that loosen mucus so it can be coughed up (expectorants).  Medicines that relax the muscles of the bronchi (bronchodilators).  Antibiotic medicines to prevent or treat infection.  Physical therapy to help clear mucus from the lungs. Techniques may include: ? Postural drainage. This is when you sit or lie in certain positions so that mucus can drain by gravity. ? Chest percussion. This involves tapping the chest or back with a cupped hand. ? Chest vibration. For this therapy, a hand or special equipment vibrates your chest and back.  Surgery to remove the affected part of the lung. This may be done in severe cases. Follow these instructions at home: Medicines  Take over-the-counter and prescription medicines only as told by your health care provider.  If you were prescribed an antibiotic medicine, take it as told by your health care provider. Do not stop taking the antibiotic even if you start to feel better.  Avoid taking sedatives and antihistamines unless your health care provider tells you to take them. These medicines tend to thicken the mucus in the lungs. Managing symptoms  Perform breathing exercises or techniques to clear your lungs as told by your health care provider.  Consider using a cold steam vaporizer or humidifier in your room or home to help loosen secretions.  If you have a cough that gets worse at night, try sleeping in a semi-upright position. General instructions  Get plenty of rest.  Drink enough  fluid to keep your urine clear or pale yellow.  Stay inside when pollution and ozone levels are high.  Stay up to date with vaccinations and immunizations.  Avoid cigarette smoke and other lung irritants.  Do not use any products that contain nicotine or tobacco, such as cigarettes and e-cigarettes. If you need help quitting, ask your health care provider.  Keep all follow-up visits as told by your health care provider. This is important. Contact a health care provider if:  You cough up more sputum than before and the sputum is yellow or green in color.  You have a fever.  You cannot control your cough and are losing sleep. Get help right away if:  You cough up blood.  You have chest pain.  You have increasing shortness of breath.  You have pain that gets worse or is not controlled with medicines.  You have a fever and your symptoms suddenly get worse. Summary  Bronchiectasis is a condition in which the airways in the lungs (bronchi) are damaged and widened. The condition makes it hard for the lungs to get rid of mucus, and it causes mucus to gather in the bronchi.  Treatment usually includes therapy to help clear mucus from the lungs.  Stay up to date with vaccinations and immunizations. This information is not intended to replace advice given to you by your health care provider. Make sure you discuss any questions you have with your health care provider. Document Released: 07/27/2007 Document Revised: 11/03/2016 Document Reviewed: 11/03/2016 Elsevier Interactive Patient Education  2019 [employer].

## 2019-04-07 NOTE — Progress Notes (Signed)
@Patient  ID: Eric [name], male    DOB: 11-Nov-1945, 73 y.o.   MRN: [id number]  Chief Complaint  Patient presents with   Cough    Referring provider: Lajean Manes, MD  HPI 57 yowm never smokerplayed college bball for [employer] with severe pna/pleurisy around 2010 admitted to [place] complicated by empyema on R then 100% back to nl then 2018 pattern of Daily chronic cough with sensation of excess pnds x doesn't bother him at hs so referred to pulmonary clinic2/18/2020by Dr. Felipa Eth.  Tests: Upper airway cough syndrome Onset 2018 - FENO 11/30/2018 = 13  - Allergy profile 11/30/2018 >Eos 0. / IgE  - cyclical cough rx with 1st gen H1 blockers per guidelines  CT Chest 02/2017 -Lungs/Pleura: Biapical pleuroparenchymal scarring. Scattered post infectious scarring and bronchiectasis in the right middle and left lower lobes. No pleural fluid. Airway is unremarkable.   CT chest 04/04/19 - There is multifocal tubular bronchiectasis and scarring associated with clustered centrilobular nodularity, most conspicuous in the right middle lobe, with very substantial volume loss. Bronchiectasis is notably worsened in the left lower lobe, with increased, clustered centrilobular nodularity of the bilateral lower lobes and peripheral right upper lobe. Findings are consistent with ongoing, worsened atypical infection, particularly including atypical mycobacterium.  OV 04/07/19 - Follow up Patient presents today for a follow-up.  Was scheduled for PFT this morning but was not able to complete PFT.  Patient did have recent CT scan which showed bronchiectasis and atypical infection/questionable Mycobacterium.  At patient's last visit on 01/26/2019 with Sarah patient was ordered sputum samples, but unfortunately has not been able to produce a sample to bring in.  Patient continues have ongoing cough.  His cough is nonproductive.  Patient has followed previous recommendations from Dr. Melvyn Novas  including Protonix and Pepcid and Ladona Ridgel.  States this has not improved his cough.  Patient denies any recent fever. Denies f/c/s, n/v/d, hemoptysis, PND, leg swelling.    No Known Allergies  Immunization History  Administered Date(s) Administered   Influenza-Unspecified 07/13/2014, 08/13/2018    Past Medical History:  Diagnosis Date   Chest pain    Hypercholesterolemia    Osteoarthritis    Pneumonia    right middle lobe necrotizing pneumonia with abcess as well ass effusion and empyena and decortication    Tobacco History: Social History   Tobacco Use  Smoking Status Never Smoker  Smokeless Tobacco Never Used   Counseling given: Not Answered   Outpatient Encounter Medications as of 04/07/2019  Medication Sig   atorvastatin (LIPITOR) 20 MG tablet Take 1 tablet by mouth daily.   benzonatate (TESSALON) 200 MG capsule Take 1 capsule by mouth three times daily as needed for cough   famotidine (PEPCID) 20 MG tablet Take 20 mg by mouth daily.   pantoprazole (PROTONIX) 40 MG tablet Take 1 tablet (40 mg total) by mouth daily. Take 30-60 min before first meal of the day   Respiratory Therapy Supplies (FLUTTER) DEVI Use as directed   azithromycin (ZITHROMAX) 250 MG tablet Take 2 tablets (500 mg) on day 1, then take 1 tablet (250 mg) on days 2-5   sodium chloride HYPERTONIC 3 % nebulizer solution Take by nebulization as needed for other.   [DISCONTINUED] atorvastatin (LIPITOR) 10 MG tablet Take 20 mg by mouth daily.    [DISCONTINUED] glucosamine-chondroitin 500-400 MG tablet Take 0.5 tablets by mouth 3 (three) times daily.   No facility-administered encounter medications on file as of 04/07/2019.  Review of Systems  Review of Systems  Constitutional: Negative.  Negative for chills and fever.  HENT: Negative.   Respiratory: Positive for cough. Negative for shortness of breath and wheezing.   Cardiovascular: Negative.  Negative for chest pain,  palpitations and leg swelling.  Gastrointestinal: Negative.   Allergic/Immunologic: Negative.   Neurological: Negative.   Psychiatric/Behavioral: Negative.        Physical Exam  BP 132/70 (BP Location: Left Arm, Patient Position: Sitting, Cuff Size: Normal)    Pulse 63    Temp 98 F (36.7 C)    Ht 6\' 2"  (1.88 m)    Wt 177 lb 9.6 oz (80.6 kg)    SpO2 97%    BMI 22.80 kg/m   Wt Readings from Last 5 Encounters:  04/07/19 177 lb 9.6 oz (80.6 kg)  11/30/18 185 lb 9.6 oz (84.2 kg)  02/25/18 183 lb (83 kg)  12/22/16 175 lb (79.4 kg)  12/19/10 180 lb (81.6 kg)     Physical Exam Vitals signs and nursing note reviewed.  Constitutional:      General: He is not in acute distress.    Appearance: He is well-developed.  Cardiovascular:     Rate and Rhythm: Normal rate and regular rhythm.  Pulmonary:     Effort: Pulmonary effort is normal. No respiratory distress.     Breath sounds: Normal breath sounds. No wheezing or rhonchi.  Musculoskeletal:        General: No swelling.  Skin:    General: Skin is warm and dry.  Neurological:     Mental Status: He is alert and oriented to person, place, and time.    Imaging: Ct Chest High Resolution  Result Date: 04/04/2019 CLINICAL DATA:  Chronic cough, bronchiectasis EXAM: CT CHEST WITHOUT CONTRAST TECHNIQUE: Multidetector CT imaging of the chest was performed following the standard protocol without intravenous contrast. High resolution imaging of the lungs, as well as inspiratory and expiratory imaging, was performed. COMPARISON:  02/25/2017 FINDINGS: Cardiovascular: No significant vascular findings. Normal heart size. No pericardial effusion. Mediastinum/Nodes: No enlarged mediastinal, hilar, or axillary lymph nodes. Thyroid gland, trachea, and esophagus demonstrate no significant findings. Lungs/Pleura: There is multifocal tubular bronchiectasis and scarring associated with clustered centrilobular nodularity, most conspicuous in the right middle  lobe, with very substantial volume loss. Bronchiectasis is notably worsened in the left lower lobe, with increased, clustered centrilobular nodularity of the bilateral lower lobes and peripheral right upper lobe. No pleural effusion or pneumothorax. Upper Abdomen: No acute abnormality. Musculoskeletal: No chest wall mass or suspicious bone lesions identified. IMPRESSION: There is multifocal tubular bronchiectasis and scarring associated with clustered centrilobular nodularity, most conspicuous in the right middle lobe, with very substantial volume loss. Bronchiectasis is notably worsened in the left lower lobe, with increased, clustered centrilobular nodularity of the bilateral lower lobes and peripheral right upper lobe. Findings are consistent with ongoing, worsened atypical infection, particularly including atypical mycobacterium. Electronically Signed   By: Eddie Candle M.D.   On: 04/04/2019 10:36   Dg Esophagus W Double Cm (hd)  Result Date: 03/21/2019 CLINICAL DATA:  Dysphagia. EXAM: ESOPHOGRAM / BARIUM SWALLOW / BARIUM TABLET STUDY TECHNIQUE: Combined double contrast and single contrast examination performed using effervescent crystals, thick barium liquid, and thin barium liquid. The patient was observed with fluoroscopy swallowing a 13 mm barium sulphate tablet. FLUOROSCOPY TIME:  Fluoroscopy Time:  1 minutes 6 seconds Radiation Exposure Index (if provided by the fluoroscopic device): 45 mGy Number of Acquired Spot Images: 10 COMPARISON:  None. FINDINGS: Swallowing mechanism and esophageal motility are normal. No esophageal fold thickening, stricture or obstruction. Tiny hiatal hernia. A 13 mm barium tablet passed into the stomach without difficulty. IMPRESSION: Tiny hiatal hernia. Electronically Signed   By: Lorin Picket M.D.   On: 03/21/2019 09:56     Assessment & Plan:   Bronchiectasis without complication [facility]) Patient presents today for a follow-up.  Was scheduled for PFT this morning but  was not able to complete PFT.  Patient did have recent CT scan which showed bronchiectasis and atypical infection/questionable Mycobacterium.  At patient's last visit on 01/26/2019 with Sarah patient was ordered sputum samples, but unfortunately has not been able to produce a sample to bring in.  Patient continues have ongoing cough.  His cough is nonproductive.  Patient has followed previous recommendations from Dr. Melvyn Novas including Protonix and Pepcid and Ladona Ridgel.  States this has not improved his cough.  Patient denies any recent fever.   Patient Instructions  Will order azithromycin Use flutter valve 4-5 times daily Will need sputum samples Will order hypertonic saline nebulizer treatments  Follow up: Follow up with Dr. Melvyn Novas in 2-4 weeks    Bronchiectasis  Bronchiectasis is a condition in which the airways in the lungs (bronchi) are damaged and widened. The condition makes it hard for the lungs to get rid of mucus, and it causes mucus to gather in the bronchi. This condition often leads to lung infections, which can make the condition worse. What are the causes? You can be born with this condition or you can develop it later in life. Common causes of this condition include:  Cystic fibrosis.  Repeated lung infections, such as pneumonia or tuberculosis.  An object or other blockage in the lungs.  Breathing in fluid, food, or other objects (aspiration).  A problem with the immune system and lung structure that is present at birth (congenital). Sometimes the cause is not known. What are the signs or symptoms? Common symptoms of this condition include:  A daily cough that brings up mucus and lasts for more than 3 weeks.  Lung infections that happen often.  Shortness of breath and wheezing.  Weakness and fatigue. How is this diagnosed? This condition is diagnosed with tests, such as:  Chest X-rays or CT scans. These are done to check for changes in the  lungs.  Breathing tests. These are done to check how well your lungs are working.  A test of a sample of your saliva (sputum culture). This test is done to check for infection.  Blood tests and other tests. These are done to check for related diseases or causes. How is this treated? Treatment for this condition depends on the severity of the illness and its cause. Treatment may include:  Medicines that loosen mucus so it can be coughed up (expectorants).  Medicines that relax the muscles of the bronchi (bronchodilators).  Antibiotic medicines to prevent or treat infection.  Physical therapy to help clear mucus from the lungs. Techniques may include: ? Postural drainage. This is when you sit or lie in certain positions so that mucus can drain by gravity. ? Chest percussion. This involves tapping the chest or back with a cupped hand. ? Chest vibration. For this therapy, a hand or special equipment vibrates your chest and back.  Surgery to remove the affected part of the lung. This may be done in severe cases. Follow these instructions at home: Medicines  Take over-the-counter and prescription medicines only as told by  your health care provider.  If you were prescribed an antibiotic medicine, take it as told by your health care provider. Do not stop taking the antibiotic even if you start to feel better.  Avoid taking sedatives and antihistamines unless your health care provider tells you to take them. These medicines tend to thicken the mucus in the lungs. Managing symptoms  Perform breathing exercises or techniques to clear your lungs as told by your health care provider.  Consider using a cold steam vaporizer or humidifier in your room or home to help loosen secretions.  If you have a cough that gets worse at night, try sleeping in a semi-upright position. General instructions  Get plenty of rest.  Drink enough fluid to keep your urine clear or pale yellow.  Stay inside  when pollution and ozone levels are high.  Stay up to date with vaccinations and immunizations.  Avoid cigarette smoke and other lung irritants.  Do not use any products that contain nicotine or tobacco, such as cigarettes and e-cigarettes. If you need help quitting, ask your health care provider.  Keep all follow-up visits as told by your health care provider. This is important. Contact a health care provider if:  You cough up more sputum than before and the sputum is yellow or green in color.  You have a fever.  You cannot control your cough and are losing sleep. Get help right away if:  You cough up blood.  You have chest pain.  You have increasing shortness of breath.  You have pain that gets worse or is not controlled with medicines.  You have a fever and your symptoms suddenly get worse. Summary  Bronchiectasis is a condition in which the airways in the lungs (bronchi) are damaged and widened. The condition makes it hard for the lungs to get rid of mucus, and it causes mucus to gather in the bronchi.  Treatment usually includes therapy to help clear mucus from the lungs.  Stay up to date with vaccinations and immunizations. This information is not intended to replace advice given to you by your health care provider. Make sure you discuss any questions you have with your health care provider. Document Released: 07/27/2007 Document Revised: 11/03/2016 Document Reviewed: 11/03/2016 Elsevier Interactive Patient Education  2019 [place], [place] 04/07/2019

## 2019-04-08 NOTE — Telephone Encounter (Signed)
Looks like order was never placed for a nebulizer machine under referral  I will place referral for DME  Neb sol not sent  [facility] who is the pharmacy on file- they do not have in stock  Usually [facility] has this  [place]- how do you want this to be taken so we can call in  Rx on file not specific enough  Please advise, thanks!

## 2019-04-08 NOTE — Telephone Encounter (Signed)
I went back and checked this rx was printed im not sure if it was faxed somewhere since there is no order to document on

## 2019-04-08 NOTE — Telephone Encounter (Signed)
Hinton Dyer could you clarify this. Please order nebulizer machine and solution.  I discussed at length with patient during visit that he would be ordered hypertonic saline saline nebulizer treatments to use twice daily for bronchiectasis. Not sure why he states that he didn't know about it.

## 2019-04-08 NOTE — Telephone Encounter (Signed)
There is no order for a nebulizer that was sent to [facility] and the RX was sent to [facility]

## 2019-04-08 NOTE — Telephone Encounter (Signed)
Pt states the pharmacy doesn't have the medicine for the nebulizer.  Pt uses [facility] on [address].  Please advise.  [phone].

## 2019-04-10 ENCOUNTER — Telehealth: Payer: Self-pay | Admitting: Acute Care

## 2019-04-10 NOTE — Telephone Encounter (Signed)
Dr. Melvyn Novas, This patient has had ongoing cough with little improvement after initial therapy with prednisone taper, protonix, pepcid, and Tessalon Perles and Chlor tabs. ( Stacking therapy). He also has bronchiectasis. My plan when I saw him 01/26/2019  had been sputum cultures and then treatment with ABX guided by sensitivities. He was unable to cough up anything for a culture. I also started Flutter valve and mucinex. He was then seen by Kenney Houseman 04/07/2019 and was started on Azithro, as there was no culture or sensitivities,  and hypertonic saline nebs BID. ( I think there were some issues with getting a neb machine and getting the hypertonic saline nebs.) I suspect he may need a bronch to get sputum cultures. . I had also ordered an HRCT  which shows the following.  There is multifocal tubular bronchiectasis and scarring associated with clustered centrilobular nodularity, most conspicuous in the right middle lobe, with very substantial volume loss. Bronchiectasis is notably worsened in the left lower lobe, with increased, clustered centrilobular nodularity of the bilateral lower lobes and peripheral right upper lobe. Findings are consistent with ongoing, worsened atypical infection, particularly including atypical Mycobacterium.  He has a follow up appointment 05/03/2019 with you, but I wanted to give you a heads up to see if you want to schedule him for a bronch prior to that date. Let me know your thoughts.  Thanks

## 2019-04-11 MED ORDER — SODIUM CHLORIDE 3 % IN NEBU: INHALATION_SOLUTION | Freq: Two times a day (BID) | RESPIRATORY_TRACT | 11 refills | Status: DC

## 2019-04-11 NOTE — Telephone Encounter (Signed)
DME order was placed 6/25 for pt to be provided nebulizer machine. With the hypertonic saline solution, usually we have to get this through [facility].   I have fixed the hypertonic saline nebulizer Rx with the instructions for pt to take bid. Contacted pt to let him know that [facility] is where the neb sol is having to be sent as his pharmacy does not carry it and pt verbalized understanding. Rx has been sent in. Nothing further needed.

## 2019-04-25 NOTE — Progress Notes (Signed)
Chart and office note reviewed in detail  > agree with a/p as outlined    

## 2019-05-03 ENCOUNTER — Other Ambulatory Visit: Payer: Self-pay

## 2019-05-03 ENCOUNTER — Ambulatory Visit (INDEPENDENT_AMBULATORY_CARE_PROVIDER_SITE_OTHER): Payer: Medicare Other | Admitting: Internal Medicine

## 2019-05-03 ENCOUNTER — Encounter: Payer: Self-pay | Admitting: Internal Medicine

## 2019-05-03 DIAGNOSIS — J479 Bronchiectasis, uncomplicated: Secondary | ICD-10-CM | POA: Diagnosis not present

## 2019-05-03 DIAGNOSIS — R05 Cough: Secondary | ICD-10-CM

## 2019-05-03 DIAGNOSIS — R058 Other specified cough: Secondary | ICD-10-CM

## 2019-05-03 MED ORDER — AZITHROMYCIN 250 MG PO TABS: ORAL_TABLET | ORAL | 11 refills | Status: DC

## 2019-05-03 NOTE — Assessment & Plan Note (Signed)
Onset 2018 - FENO 11/30/2018  =   13  - Allergy profile 11/30/2018 >  Eos 0.2/  IgE  47  RAST pos cats >> dogs - cyclical cough rx with 1st gen H1 blockers per guidelines    Reviewed allergies/ need for gerd rx for cough flare due to cyclical nature of cough/ gerd.

## 2019-05-03 NOTE — Assessment & Plan Note (Addendum)
Onset of symptoms 2018  CT 6 /22/2020  HRCT Chest Multifocal tubular bronchiectasis and scarring associated with clustered centrilobular nodularity, most conspicuous in the right middle lobe, with very substantial volume loss. Bronchiectasis is notably worsened in the left lower lobe, with increased, clustered centrilobular nodularity of the bilateral lower lobes and peripheral right upper lobe  C/w  worsened atypical infection   - rx 04/04/19 with flutter and  prn zpak > marked improvement as of 05/03/2019    Prolonged discussion re pathophysiology and rx strategies - since he did so well with so little rx rec he continue same x 3 months and then return to regroup with pfts to complete the w/u  - see avs for instructions unique to this ov    > 50% of 25 min ov spent on counseling

## 2019-05-03 NOTE — Progress Notes (Signed)
Eric [name], male    DOB: [birth year],    MRN: [id number]   Brief patient profile:  16 yowm never smoker played college bball for [employer] with severe pna/pleurisy  around 2010 admitted to [facility] complicated by empyema on R VATS  then 100% back to nl then  2018 pattern of  Daily chronic  cough with sensation of excess pnds x doesn't bother him at hs so referred to pulmonary clinic 11/30/2018 by Dr   Felipa [name] with documented bronchiectasis on  HRCT  04/04/2019      History of Present Illness  11/30/2018  Pulmonary/ 1st office eval/Eric [name]  Chief Complaint  Patient presents with  . Pulmonary Consult    Referred by Eric [name]. Pt c/o cough x 2 yrs, worse over the past year. His cough is worse with rest and is occ prod with min sputum-? color.   Dyspnea:  Not limited by breathing from desired activities , limited more by the back Cough: min mucus, clear assoc with overt HB Sleep: fine typically on side/ flat bed one pillow SABA use: none otc allergy rotates as "they all stop working [employer]"  and on nasal spray  No allergy or ent w/u to date codeine helped some but constipating/ does not remember being on prednisone    Eric [name] Reflux v Neurogenic Cough Differentiator Reflux Comments  Do you awaken from a sound sleep coughing violently?                            With trouble breathing? Rarely   Do you have choking episodes when you cannot  Get enough air, gasping for air ?              rarely   Do you usually cough when you lie down into  The bed, or when you just lie down to rest ?                          somewhat   Do you usually cough after meals or eating?         no   Do you cough when (or after) you bend over?    No    GERD SCORE     Eric [name] Reflux v Neurogenic Cough Differentiator Neurogenic   Do you more-or-less cough all day long? sporacdic when still    Does change of temperature make you cough? no   Does laughing or chuckling cause you to cough? maybe    Do fumes (perfume, automobile fumes, burned  Toast, etc.,) cause you to cough ?      Yes    Does speaking, singing, or talking on the phone cause you to cough   ?               No    Neurogenic/Airway score     rec Prednisone 10 mg take  4 each am x 2 days,   2 each am x 2 days,  1 each am x 2 days and stop  Pantoprazole (protonix) 40 mg   Take  30-60 min before first meal of the day and Pepcid (famotidine)  20 mg one after supper  until return to office - this is the best way to tell whether stomach acid is contributing to your problem.   GERD diet    NP rec 01/26/2019 We will send refills for you Tessalon and Protonix. We  will get a sputum specimen for culture  to make sure infection is not a component of your cough Continue cough regimen Delsym 2 teaspoons twice daily ,  Tessalon perles every 8 hours, At bedtime add  chlor tab 4 mg. It is important to stack these medications for them to work properly. There is a cumulative effect. Continue  Pantoprazole (protonix) 40 mg   Take  30-60 min before first meal of the day.  Continue Pepcid (famotidine)  20 mg one after supper   Start using a flutter valve , we will have one of the nurses instruct you on use. Please use it 3-4 times daily. Remember to wash it once weekly with  We will schedule an appointment in June 2020 with Dr. Melvyn Novas. We will schedule PFT's and HRCT chest without contrast in June prior to your appointment so Dr. Melvyn Novas can review the results with you.  Sips of water instead of throat clearing Follow GERD diet    NP rec 04/07/19 Will order azithromycin Use flutter valve 4-5 times daily Will need sputum samples Will order hypertonic saline nebulizer treatments    05/03/2019  f/u ov/[name] re: bronchiectasis  Chief Complaint  Patient presents with  . Follow-up    Cough is much improved.   Dyspnea:  Not limited by breathing from desired activities   Cough: resolved p zmax and NS  Sleeping: ok flat  SABA use: no   02: no  Overt hb rx with rolaids/ not using ppi or pepcid   No obvious day to day or daytime variability or assoc excess/ purulent sputum or mucus plugs or hemoptysis or cp or chest tightness, subjective wheeze or overt sinus  symptoms.   Sleeping ok as above  without nocturnal  or early am exacerbation  of respiratory  c/o's or need for noct saba. Also denies any obvious fluctuation of symptoms with weather or environmental changes or other aggravating or alleviating factors except as outlined above   No unusual exposure hx or h/o childhood pna/ asthma or knowledge of premature birth.  Current Allergies, Complete Past Medical History, Past Surgical History, Family History, and Social History were reviewed in [employer] record.  ROS  The following are not active complaints unless bolded Hoarseness, sore throat, dysphagia, dental problems, itching, sneezing,  nasal congestion or discharge of excess mucus or purulent secretions, ear ache,   fever, chills, sweats, unintended wt loss or wt gain, classically pleuritic or exertional cp,  orthopnea pnd or arm/hand swelling  or leg swelling, presyncope, palpitations, abdominal pain, anorexia, nausea, vomiting, diarrhea  or change in bowel habits or change in bladder habits, change in stools or change in urine, dysuria, hematuria,  rash, arthralgias, visual complaints, headache, numbness, weakness or ataxia or problems with walking or coordination,  change in mood or  memory.        Current Meds  Medication Sig  . atorvastatin (LIPITOR) 20 MG tablet Take 1 tablet by mouth daily.  Marland Kitchen Respiratory Therapy Supplies (FLUTTER) DEVI Use as directed  . sodium chloride HYPERTONIC 3 % nebulizer solution Take by nebulization 2 (two) times a day.  . [DISCONTINUED] azithromycin (ZITHROMAX) 250 MG tablet Take 2 tablets (500 mg) on day 1, then take 1 tablet (250 mg) on days 2-5              Past Medical History:  Diagnosis Date  .  Chest pain   . Hypercholesterolemia   . Osteoarthritis   . Pneumonia  right middle lobe necrotizing pneumonia with abcess as well ass effusion and empyena and decortication       Objective:     amb wm nad nad   Wt Readings from Last 3 Encounters:  05/03/19 179 lb 12.8 oz (81.6 kg)  04/07/19 177 lb 9.6 oz (80.6 kg)  11/30/18 185 lb 9.6 oz (84.2 kg)     Vital signs reviewed - Note on arrival 02 sats  98% on RA     Pleasant amb wm nad  HEENT: nl dentition, turbinates bilaterally, and oropharynx. Nl external ear canals without cough reflex   NECK :  without JVD/Nodes/TM/ nl carotid upstrokes bilaterally   LUNGS: no acc muscle use,  Nl contour chest which is clear to A and P bilaterally without cough on insp or exp maneuvers   CV:  RRR  no s3 or murmur or increase in P2, and no edema   ABD:  soft and nontender with nl inspiratory excursion in the supine position. No bruits or organomegaly appreciated, bowel sounds nl  MS:  Nl gait/ ext warm without deformities, calf tenderness, cyanosis or clubbing No obvious joint restrictions   SKIN: warm and dry without lesions    NEURO:  alert, approp, nl sensorium with  no motor or cerebellar deficits apparent.         DgEs 03/21/19 Tiny hiatal hernia. No reflux noted     I personally reviewed images and agree with radiology impression as follows:   HRCT  04/04/2019  There is multifocal tubular bronchiectasis and scarring associated with clustered centrilobular nodularity, most conspicuous in the right middle lobe, with very substantial volume loss. Bronchiectasis is notably worsened in the left lower lobe, with increased, clustered centrilobular nodularity of the bilateral lower lobes and peripheral right upper lobe. Findings are consistent with ongoing, worsened atypical infection, particularly including atypical mycobacterium.     Assessment

## 2019-05-03 NOTE — Patient Instructions (Addendum)
For flare of cough  > zpak is refillable and you should take Try prilosec otc 20mg   Take 30-60 min before first meal of the day and Pepcid ac (famotidine) 20 mg one @  bedtime until cough is completely gone for at least a week without the need for cough suppression      For cough/ congestion ok to use the nebulized saline up to every 4 hours as needed and use the flutter valve as much as you want  Bronchiectasis =   you have scarring of your bronchial tubes which means that they don't function perfectly normally and mucus tends to pool in certain areas of your lung which can cause pneumonia and further scarring of your lung and bronchial tubes  Whenever you develop cough congestion take mucinex or mucinex dm > these will help keep the mucus loose and flowing but if your condition worsens you need to seek help immediately preferably here or somewhere inside the [facility] system to compare xrays ( worse = darker or bloody mucus or pain on breathing in)      Please schedule a follow up visit in 3 months but call sooner if needed with PFT

## 2019-07-01 DIAGNOSIS — J3081 Allergic rhinitis due to animal (cat) (dog) hair and dander: Secondary | ICD-10-CM | POA: Diagnosis not present

## 2019-07-01 DIAGNOSIS — R05 Cough: Secondary | ICD-10-CM | POA: Diagnosis not present

## 2019-07-01 DIAGNOSIS — J470 Bronchiectasis with acute lower respiratory infection: Secondary | ICD-10-CM | POA: Diagnosis not present

## 2019-07-01 DIAGNOSIS — K219 Gastro-esophageal reflux disease without esophagitis: Secondary | ICD-10-CM | POA: Insufficient documentation

## 2019-07-01 DIAGNOSIS — R053 Chronic cough: Secondary | ICD-10-CM | POA: Insufficient documentation

## 2019-07-05 DIAGNOSIS — K219 Gastro-esophageal reflux disease without esophagitis: Secondary | ICD-10-CM | POA: Diagnosis not present

## 2019-07-05 DIAGNOSIS — R05 Cough: Secondary | ICD-10-CM | POA: Diagnosis not present

## 2019-07-05 DIAGNOSIS — J470 Bronchiectasis with acute lower respiratory infection: Secondary | ICD-10-CM | POA: Diagnosis not present

## 2019-07-05 DIAGNOSIS — Z79899 Other long term (current) drug therapy: Secondary | ICD-10-CM | POA: Diagnosis not present

## 2019-07-05 DIAGNOSIS — Z7982 Long term (current) use of aspirin: Secondary | ICD-10-CM | POA: Diagnosis not present

## 2019-07-05 DIAGNOSIS — J3081 Allergic rhinitis due to animal (cat) (dog) hair and dander: Secondary | ICD-10-CM | POA: Diagnosis not present

## 2019-07-07 DIAGNOSIS — H6121 Impacted cerumen, right ear: Secondary | ICD-10-CM | POA: Diagnosis not present

## 2019-07-15 DIAGNOSIS — J3081 Allergic rhinitis due to animal (cat) (dog) hair and dander: Secondary | ICD-10-CM | POA: Diagnosis not present

## 2019-07-15 DIAGNOSIS — K219 Gastro-esophageal reflux disease without esophagitis: Secondary | ICD-10-CM | POA: Diagnosis not present

## 2019-07-15 DIAGNOSIS — J470 Bronchiectasis with acute lower respiratory infection: Secondary | ICD-10-CM | POA: Diagnosis not present

## 2019-07-15 DIAGNOSIS — R05 Cough: Secondary | ICD-10-CM | POA: Diagnosis not present

## 2019-07-21 DIAGNOSIS — Z23 Encounter for immunization: Secondary | ICD-10-CM | POA: Diagnosis not present

## 2019-08-04 ENCOUNTER — Ambulatory Visit: Payer: Medicare Other | Admitting: Internal Medicine

## 2019-08-18 DIAGNOSIS — J310 Chronic rhinitis: Secondary | ICD-10-CM | POA: Diagnosis not present

## 2019-08-18 DIAGNOSIS — J479 Bronchiectasis, uncomplicated: Secondary | ICD-10-CM | POA: Diagnosis not present

## 2019-08-18 DIAGNOSIS — R05 Cough: Secondary | ICD-10-CM | POA: Diagnosis not present

## 2019-09-07 ENCOUNTER — Other Ambulatory Visit: Payer: Self-pay

## 2019-09-07 ENCOUNTER — Ambulatory Visit (INDEPENDENT_AMBULATORY_CARE_PROVIDER_SITE_OTHER): Payer: Medicare Other | Admitting: Podiatry

## 2019-09-07 ENCOUNTER — Other Ambulatory Visit: Payer: Self-pay | Admitting: Podiatry

## 2019-09-07 ENCOUNTER — Encounter: Payer: Self-pay | Admitting: Podiatry

## 2019-09-07 ENCOUNTER — Ambulatory Visit (INDEPENDENT_AMBULATORY_CARE_PROVIDER_SITE_OTHER): Payer: Medicare Other

## 2019-09-07 DIAGNOSIS — M722 Plantar fascial fibromatosis: Secondary | ICD-10-CM | POA: Diagnosis not present

## 2019-09-07 DIAGNOSIS — M79672 Pain in left foot: Secondary | ICD-10-CM | POA: Diagnosis not present

## 2019-09-07 MED ORDER — DICLOFENAC SODIUM 75 MG PO TBEC: 75.0000 mg | DELAYED_RELEASE_TABLET | Freq: Two times a day (BID) | ORAL | 2 refills | Status: DC

## 2019-09-07 NOTE — Progress Notes (Signed)
Subjective:   Patient ID: Eric [name], male   DOB: 73 y.o.   MRN: [id number]   HPI Patient presents stating I have a lot of pain in my left heel and states is been going on for a month and I do not remember injury.  Patient states this is the first event that he has had like this and patient does not smoke and would like to be more active   Review of Systems  All other systems reviewed and are negative.       Objective:  Physical Exam Vitals signs and nursing note reviewed.  Constitutional:      Appearance: He is well-developed.  Pulmonary:     Effort: Pulmonary effort is normal.  Musculoskeletal: Normal range of motion.  Skin:    General: Skin is warm.  Neurological:     Mental Status: He is alert.     Neurovascular status was found to be intact with patient noted to have high arch foot structure with an exquisite discomfort plantar aspect left heel at the insertional point of the tendon into the calcaneus with inflammation fluid around the medial band.  Patient was found to have good digital perfusion is well oriented x3    Assessment:  Acute plantar fasciitis left with inflammation fluid around the medial band     Plan:  H&P condition reviewed and I educated him on condition.  Today sterile prep in the plantar fascia is injected 3 mg Kenalog 5 mg Xylocaine and plantar fascial brace administered with good support.  Patient will be seen back to recheck and will utilize supportive shoes  X-rays indicate spur but did not indicate stress fracture arthritis

## 2019-09-07 NOTE — Patient Instructions (Signed)

## 2019-09-15 DIAGNOSIS — M47816 Spondylosis without myelopathy or radiculopathy, lumbar region: Secondary | ICD-10-CM | POA: Diagnosis not present

## 2019-09-15 DIAGNOSIS — M4326 Fusion of spine, lumbar region: Secondary | ICD-10-CM | POA: Diagnosis not present

## 2019-09-20 DIAGNOSIS — M5416 Radiculopathy, lumbar region: Secondary | ICD-10-CM | POA: Diagnosis not present

## 2019-09-21 ENCOUNTER — Other Ambulatory Visit: Payer: Self-pay

## 2019-09-21 ENCOUNTER — Ambulatory Visit (INDEPENDENT_AMBULATORY_CARE_PROVIDER_SITE_OTHER): Payer: Medicare Other | Admitting: Podiatry

## 2019-09-21 ENCOUNTER — Encounter: Payer: Self-pay | Admitting: Podiatry

## 2019-09-21 DIAGNOSIS — M722 Plantar fascial fibromatosis: Secondary | ICD-10-CM | POA: Diagnosis not present

## 2019-09-21 NOTE — Progress Notes (Signed)
Subjective:   Patient ID: Eric [name], male   DOB: 73 y.o.   MRN: [id number]   HPI Patient states it is somewhat improved but he still has an area of pain in the plantar aspect of the left heel but more in the proximal portion of that sort with a high arch foot structure   ROS      Objective:  Physical Exam  Plan fasciitis left but still present but in a more proximal direction with high arch foot structure which is a part of the problem     Assessment:  Pain still noted in the plantar fascia but more proximal     Plan:  Sterile prep and injected the fascia 3 mg Kenalog 5 mg Xylocaine and dispensed power step insoles to take pressure off the bottom of the foot.  Reappoint 5 weeks to recheck

## 2019-09-30 ENCOUNTER — Other Ambulatory Visit: Payer: Self-pay | Admitting: Physician Assistant

## 2019-09-30 DIAGNOSIS — L570 Actinic keratosis: Secondary | ICD-10-CM | POA: Diagnosis not present

## 2019-09-30 DIAGNOSIS — C44519 Basal cell carcinoma of skin of other part of trunk: Secondary | ICD-10-CM | POA: Diagnosis not present

## 2019-10-04 DIAGNOSIS — Z6823 Body mass index (BMI) 23.0-23.9, adult: Secondary | ICD-10-CM | POA: Diagnosis not present

## 2019-10-04 DIAGNOSIS — M545 Low back pain: Secondary | ICD-10-CM | POA: Diagnosis not present

## 2019-10-04 DIAGNOSIS — M7071 Other bursitis of hip, right hip: Secondary | ICD-10-CM | POA: Diagnosis not present

## 2019-10-04 DIAGNOSIS — M4326 Fusion of spine, lumbar region: Secondary | ICD-10-CM | POA: Diagnosis not present

## 2019-10-04 DIAGNOSIS — M5416 Radiculopathy, lumbar region: Secondary | ICD-10-CM | POA: Diagnosis not present

## 2019-10-18 DIAGNOSIS — K219 Gastro-esophageal reflux disease without esophagitis: Secondary | ICD-10-CM | POA: Diagnosis not present

## 2019-10-18 DIAGNOSIS — J470 Bronchiectasis with acute lower respiratory infection: Secondary | ICD-10-CM | POA: Diagnosis not present

## 2019-10-18 DIAGNOSIS — R05 Cough: Secondary | ICD-10-CM | POA: Diagnosis not present

## 2019-10-19 ENCOUNTER — Other Ambulatory Visit: Payer: Self-pay

## 2019-10-19 ENCOUNTER — Ambulatory Visit (INDEPENDENT_AMBULATORY_CARE_PROVIDER_SITE_OTHER): Payer: Medicare Other | Admitting: Podiatry

## 2019-10-19 ENCOUNTER — Encounter: Payer: Self-pay | Admitting: Podiatry

## 2019-10-19 DIAGNOSIS — M722 Plantar fascial fibromatosis: Secondary | ICD-10-CM

## 2019-10-19 NOTE — Progress Notes (Signed)
Subjective:   Patient ID: Eric [name], male   DOB: 74 y.o.   MRN: [id number]   HPI Patient states he still having a lot of pain in his left heel and states it seems to be worse when he bears weight down on it.  States it is been aggravating and making it hard to walk   ROS      Objective:  Physical Exam  Neurovascular status intact with exquisite discomfort plantar lateral aspect of the left heel with inflammation fluid buildup     Assessment:  Acute plantar fasciitis left with inflammation fluid buildup     Plan:  H&P reviewed condition and I recommended conservative treatment.  I did sterile prep and injected from the lateral side and the plantar lateral aspect of the fascia 3 mg Dexasone Kenalog 5 mg Xylocaine I then applied air fracture walker to completely immobilize and I want him to wear this full-time for 3 weeks and I will recheck him again in 4 weeks

## 2019-10-28 DIAGNOSIS — M7071 Other bursitis of hip, right hip: Secondary | ICD-10-CM | POA: Diagnosis not present

## 2019-10-28 DIAGNOSIS — M5416 Radiculopathy, lumbar region: Secondary | ICD-10-CM | POA: Diagnosis not present

## 2019-10-28 DIAGNOSIS — M4726 Other spondylosis with radiculopathy, lumbar region: Secondary | ICD-10-CM | POA: Diagnosis not present

## 2019-10-28 DIAGNOSIS — M4326 Fusion of spine, lumbar region: Secondary | ICD-10-CM | POA: Diagnosis not present

## 2019-11-01 DIAGNOSIS — M4726 Other spondylosis with radiculopathy, lumbar region: Secondary | ICD-10-CM | POA: Diagnosis not present

## 2019-11-01 DIAGNOSIS — M545 Low back pain: Secondary | ICD-10-CM | POA: Diagnosis not present

## 2019-11-04 ENCOUNTER — Ambulatory Visit: Payer: Medicare Other | Attending: Internal Medicine

## 2019-11-04 DIAGNOSIS — Z23 Encounter for immunization: Secondary | ICD-10-CM | POA: Insufficient documentation

## 2019-11-04 NOTE — Progress Notes (Signed)
   Covid-19 Vaccination Clinic  Name:  Eric [name]    MRN: [id number] DOB: 1946-05-12  11/04/2019  [name] was observed post Covid-19 immunization for 15 minutes without incidence. He was provided with Vaccine Information Sheet and instruction to access the V-Safe system.   [name] was instructed to call 911 with any severe reactions post vaccine: Marland Kitchen Difficulty breathing  . Swelling of your face and throat  . A fast heartbeat  . A bad rash all over your body  . Dizziness and weakness    Immunizations Administered    Name Date Dose VIS Date Route   Pfizer COVID-19 Vaccine 11/04/2019  4:50 PM 0.3 mL 09/23/2019 Intramuscular   Manufacturer: [place]   Lot: [id number]   [place]: [id number]

## 2019-11-10 DIAGNOSIS — Z79899 Other long term (current) drug therapy: Secondary | ICD-10-CM | POA: Diagnosis not present

## 2019-11-10 DIAGNOSIS — J479 Bronchiectasis, uncomplicated: Secondary | ICD-10-CM | POA: Diagnosis not present

## 2019-11-10 DIAGNOSIS — Z1389 Encounter for screening for other disorder: Secondary | ICD-10-CM | POA: Diagnosis not present

## 2019-11-10 DIAGNOSIS — M5441 Lumbago with sciatica, right side: Secondary | ICD-10-CM | POA: Diagnosis not present

## 2019-11-10 DIAGNOSIS — Z0000 Encounter for general adult medical examination without abnormal findings: Secondary | ICD-10-CM | POA: Diagnosis not present

## 2019-11-10 DIAGNOSIS — I700 Atherosclerosis of aorta: Secondary | ICD-10-CM | POA: Diagnosis not present

## 2019-11-10 DIAGNOSIS — K219 Gastro-esophageal reflux disease without esophagitis: Secondary | ICD-10-CM | POA: Diagnosis not present

## 2019-11-10 DIAGNOSIS — M5442 Lumbago with sciatica, left side: Secondary | ICD-10-CM | POA: Diagnosis not present

## 2019-11-10 DIAGNOSIS — E7800 Pure hypercholesterolemia, unspecified: Secondary | ICD-10-CM | POA: Diagnosis not present

## 2019-11-10 DIAGNOSIS — G8929 Other chronic pain: Secondary | ICD-10-CM | POA: Diagnosis not present

## 2019-11-16 ENCOUNTER — Other Ambulatory Visit: Payer: Self-pay

## 2019-11-16 ENCOUNTER — Encounter: Payer: Self-pay | Admitting: Podiatry

## 2019-11-16 ENCOUNTER — Ambulatory Visit (INDEPENDENT_AMBULATORY_CARE_PROVIDER_SITE_OTHER): Payer: Medicare Other | Admitting: Podiatry

## 2019-11-16 VITALS — Temp 96.0°F

## 2019-11-16 DIAGNOSIS — M25552 Pain in left hip: Secondary | ICD-10-CM | POA: Diagnosis not present

## 2019-11-16 DIAGNOSIS — M6281 Muscle weakness (generalized): Secondary | ICD-10-CM | POA: Diagnosis not present

## 2019-11-16 DIAGNOSIS — M722 Plantar fascial fibromatosis: Secondary | ICD-10-CM

## 2019-11-16 DIAGNOSIS — M25551 Pain in right hip: Secondary | ICD-10-CM | POA: Diagnosis not present

## 2019-11-16 DIAGNOSIS — M545 Low back pain: Secondary | ICD-10-CM | POA: Diagnosis not present

## 2019-11-16 DIAGNOSIS — R262 Difficulty in walking, not elsewhere classified: Secondary | ICD-10-CM | POA: Diagnosis not present

## 2019-11-16 NOTE — Progress Notes (Signed)
Subjective:   Patient ID: Eric [name], male   DOB: 74 y.o.   MRN: [id number]   HPI Patient states I am improving but I am still having some pain and I am concerned when I get more active as to whether or not pain will recur again   ROS      Objective:  Physical Exam  Neurovascular status intact with patient found to have significant diminishment of discomfort in the left plantar fascia at the insertion of the tendon into the calcaneus with inflammation fluid noted on the lateral portion of the tendon but improved from previous     Assessment:  Hopeful improvement of plantar fascial symptomatology left     Plan:  H&P conditions reviewed and I have recommended that we continue immobilization but gradually reduce it over the next 2 to 3 weeks.  Patient will be seen back for [place] to recheck again and is encouraged to plan this type of future and I did discuss oral medications to continue and the utilization of ice therapy

## 2019-11-18 DIAGNOSIS — M545 Low back pain: Secondary | ICD-10-CM | POA: Diagnosis not present

## 2019-11-18 DIAGNOSIS — R262 Difficulty in walking, not elsewhere classified: Secondary | ICD-10-CM | POA: Diagnosis not present

## 2019-11-18 DIAGNOSIS — M25552 Pain in left hip: Secondary | ICD-10-CM | POA: Diagnosis not present

## 2019-11-18 DIAGNOSIS — M25551 Pain in right hip: Secondary | ICD-10-CM | POA: Diagnosis not present

## 2019-11-18 DIAGNOSIS — M6281 Muscle weakness (generalized): Secondary | ICD-10-CM | POA: Diagnosis not present

## 2019-11-21 DIAGNOSIS — M6281 Muscle weakness (generalized): Secondary | ICD-10-CM | POA: Diagnosis not present

## 2019-11-21 DIAGNOSIS — M545 Low back pain: Secondary | ICD-10-CM | POA: Diagnosis not present

## 2019-11-21 DIAGNOSIS — R262 Difficulty in walking, not elsewhere classified: Secondary | ICD-10-CM | POA: Diagnosis not present

## 2019-11-21 DIAGNOSIS — M25551 Pain in right hip: Secondary | ICD-10-CM | POA: Diagnosis not present

## 2019-11-21 DIAGNOSIS — M25552 Pain in left hip: Secondary | ICD-10-CM | POA: Diagnosis not present

## 2019-11-22 ENCOUNTER — Ambulatory Visit: Payer: Medicare Other | Attending: Internal Medicine

## 2019-11-22 DIAGNOSIS — Z23 Encounter for immunization: Secondary | ICD-10-CM | POA: Insufficient documentation

## 2019-11-22 NOTE — Progress Notes (Signed)
   Covid-19 Vaccination Clinic  Name:  Eric [name]    MRN: [id number] DOB: Aug 01, 1946  11/22/2019  Mr. Eric [name] was observed post Covid-19 immunization for 15 minutes without incidence. He was provided with Vaccine Information Sheet and instruction to access the V-Safe system.   Mr. Eric [name] was instructed to call 911 with any severe reactions post vaccine: Marland Kitchen Difficulty breathing  . Swelling of your face and throat  . A fast heartbeat  . A bad rash all over your body  . Dizziness and weakness    Immunizations Administered    Name Date Dose VIS Date Route   Pfizer COVID-19 Vaccine 11/22/2019  8:36 AM 0.3 mL 09/23/2019 Intramuscular   Manufacturer: [place]   Lot: [id number]   [place]: [id number]

## 2019-11-23 DIAGNOSIS — M25552 Pain in left hip: Secondary | ICD-10-CM | POA: Diagnosis not present

## 2019-11-23 DIAGNOSIS — R262 Difficulty in walking, not elsewhere classified: Secondary | ICD-10-CM | POA: Diagnosis not present

## 2019-11-23 DIAGNOSIS — M6281 Muscle weakness (generalized): Secondary | ICD-10-CM | POA: Diagnosis not present

## 2019-11-23 DIAGNOSIS — M545 Low back pain: Secondary | ICD-10-CM | POA: Diagnosis not present

## 2019-11-23 DIAGNOSIS — M25551 Pain in right hip: Secondary | ICD-10-CM | POA: Diagnosis not present

## 2019-11-25 DIAGNOSIS — M5416 Radiculopathy, lumbar region: Secondary | ICD-10-CM | POA: Diagnosis not present

## 2019-11-25 DIAGNOSIS — M25551 Pain in right hip: Secondary | ICD-10-CM | POA: Diagnosis not present

## 2019-11-25 DIAGNOSIS — M25552 Pain in left hip: Secondary | ICD-10-CM | POA: Diagnosis not present

## 2019-11-25 DIAGNOSIS — M545 Low back pain: Secondary | ICD-10-CM | POA: Diagnosis not present

## 2019-11-25 DIAGNOSIS — M6281 Muscle weakness (generalized): Secondary | ICD-10-CM | POA: Diagnosis not present

## 2019-11-25 DIAGNOSIS — R262 Difficulty in walking, not elsewhere classified: Secondary | ICD-10-CM | POA: Diagnosis not present

## 2019-11-25 DIAGNOSIS — M7601 Gluteal tendinitis, right hip: Secondary | ICD-10-CM | POA: Diagnosis not present

## 2019-11-28 DIAGNOSIS — M25551 Pain in right hip: Secondary | ICD-10-CM | POA: Diagnosis not present

## 2019-11-28 DIAGNOSIS — M6281 Muscle weakness (generalized): Secondary | ICD-10-CM | POA: Diagnosis not present

## 2019-11-28 DIAGNOSIS — R262 Difficulty in walking, not elsewhere classified: Secondary | ICD-10-CM | POA: Diagnosis not present

## 2019-11-28 DIAGNOSIS — M25552 Pain in left hip: Secondary | ICD-10-CM | POA: Diagnosis not present

## 2019-11-28 DIAGNOSIS — M545 Low back pain: Secondary | ICD-10-CM | POA: Diagnosis not present

## 2019-11-30 DIAGNOSIS — M25551 Pain in right hip: Secondary | ICD-10-CM | POA: Diagnosis not present

## 2019-11-30 DIAGNOSIS — M545 Low back pain: Secondary | ICD-10-CM | POA: Diagnosis not present

## 2019-11-30 DIAGNOSIS — M6281 Muscle weakness (generalized): Secondary | ICD-10-CM | POA: Diagnosis not present

## 2019-11-30 DIAGNOSIS — M25552 Pain in left hip: Secondary | ICD-10-CM | POA: Diagnosis not present

## 2019-11-30 DIAGNOSIS — R262 Difficulty in walking, not elsewhere classified: Secondary | ICD-10-CM | POA: Diagnosis not present

## 2019-12-05 DIAGNOSIS — R262 Difficulty in walking, not elsewhere classified: Secondary | ICD-10-CM | POA: Diagnosis not present

## 2019-12-05 DIAGNOSIS — M545 Low back pain: Secondary | ICD-10-CM | POA: Diagnosis not present

## 2019-12-05 DIAGNOSIS — M25552 Pain in left hip: Secondary | ICD-10-CM | POA: Diagnosis not present

## 2019-12-05 DIAGNOSIS — M6281 Muscle weakness (generalized): Secondary | ICD-10-CM | POA: Diagnosis not present

## 2019-12-05 DIAGNOSIS — M25551 Pain in right hip: Secondary | ICD-10-CM | POA: Diagnosis not present

## 2019-12-07 DIAGNOSIS — M25552 Pain in left hip: Secondary | ICD-10-CM | POA: Diagnosis not present

## 2019-12-07 DIAGNOSIS — M545 Low back pain: Secondary | ICD-10-CM | POA: Diagnosis not present

## 2019-12-07 DIAGNOSIS — M25551 Pain in right hip: Secondary | ICD-10-CM | POA: Diagnosis not present

## 2019-12-07 DIAGNOSIS — M6281 Muscle weakness (generalized): Secondary | ICD-10-CM | POA: Diagnosis not present

## 2019-12-07 DIAGNOSIS — R262 Difficulty in walking, not elsewhere classified: Secondary | ICD-10-CM | POA: Diagnosis not present

## 2019-12-12 DIAGNOSIS — M545 Low back pain: Secondary | ICD-10-CM | POA: Diagnosis not present

## 2019-12-12 DIAGNOSIS — M25551 Pain in right hip: Secondary | ICD-10-CM | POA: Diagnosis not present

## 2019-12-12 DIAGNOSIS — M6281 Muscle weakness (generalized): Secondary | ICD-10-CM | POA: Diagnosis not present

## 2019-12-12 DIAGNOSIS — R262 Difficulty in walking, not elsewhere classified: Secondary | ICD-10-CM | POA: Diagnosis not present

## 2019-12-12 DIAGNOSIS — M25552 Pain in left hip: Secondary | ICD-10-CM | POA: Diagnosis not present

## 2019-12-19 DIAGNOSIS — M25552 Pain in left hip: Secondary | ICD-10-CM | POA: Diagnosis not present

## 2019-12-19 DIAGNOSIS — M6281 Muscle weakness (generalized): Secondary | ICD-10-CM | POA: Diagnosis not present

## 2019-12-19 DIAGNOSIS — M25551 Pain in right hip: Secondary | ICD-10-CM | POA: Diagnosis not present

## 2019-12-19 DIAGNOSIS — M545 Low back pain: Secondary | ICD-10-CM | POA: Diagnosis not present

## 2019-12-19 DIAGNOSIS — R262 Difficulty in walking, not elsewhere classified: Secondary | ICD-10-CM | POA: Diagnosis not present

## 2019-12-21 DIAGNOSIS — M25551 Pain in right hip: Secondary | ICD-10-CM | POA: Diagnosis not present

## 2019-12-21 DIAGNOSIS — M545 Low back pain: Secondary | ICD-10-CM | POA: Diagnosis not present

## 2019-12-21 DIAGNOSIS — M6281 Muscle weakness (generalized): Secondary | ICD-10-CM | POA: Diagnosis not present

## 2019-12-21 DIAGNOSIS — M25552 Pain in left hip: Secondary | ICD-10-CM | POA: Diagnosis not present

## 2019-12-21 DIAGNOSIS — R262 Difficulty in walking, not elsewhere classified: Secondary | ICD-10-CM | POA: Diagnosis not present

## 2019-12-26 DIAGNOSIS — M25552 Pain in left hip: Secondary | ICD-10-CM | POA: Diagnosis not present

## 2019-12-26 DIAGNOSIS — R262 Difficulty in walking, not elsewhere classified: Secondary | ICD-10-CM | POA: Diagnosis not present

## 2019-12-26 DIAGNOSIS — M545 Low back pain: Secondary | ICD-10-CM | POA: Diagnosis not present

## 2019-12-26 DIAGNOSIS — M6281 Muscle weakness (generalized): Secondary | ICD-10-CM | POA: Diagnosis not present

## 2019-12-26 DIAGNOSIS — M25551 Pain in right hip: Secondary | ICD-10-CM | POA: Diagnosis not present

## 2020-01-04 DIAGNOSIS — M25551 Pain in right hip: Secondary | ICD-10-CM | POA: Diagnosis not present

## 2020-01-04 DIAGNOSIS — M6281 Muscle weakness (generalized): Secondary | ICD-10-CM | POA: Diagnosis not present

## 2020-01-04 DIAGNOSIS — M25552 Pain in left hip: Secondary | ICD-10-CM | POA: Diagnosis not present

## 2020-01-04 DIAGNOSIS — M722 Plantar fascial fibromatosis: Secondary | ICD-10-CM | POA: Diagnosis not present

## 2020-01-04 DIAGNOSIS — M7732 Calcaneal spur, left foot: Secondary | ICD-10-CM | POA: Diagnosis not present

## 2020-01-04 DIAGNOSIS — M545 Low back pain: Secondary | ICD-10-CM | POA: Diagnosis not present

## 2020-01-04 DIAGNOSIS — R262 Difficulty in walking, not elsewhere classified: Secondary | ICD-10-CM | POA: Diagnosis not present

## 2020-01-09 DIAGNOSIS — M6281 Muscle weakness (generalized): Secondary | ICD-10-CM | POA: Diagnosis not present

## 2020-01-09 DIAGNOSIS — M25551 Pain in right hip: Secondary | ICD-10-CM | POA: Diagnosis not present

## 2020-01-09 DIAGNOSIS — R262 Difficulty in walking, not elsewhere classified: Secondary | ICD-10-CM | POA: Diagnosis not present

## 2020-01-09 DIAGNOSIS — M25552 Pain in left hip: Secondary | ICD-10-CM | POA: Diagnosis not present

## 2020-01-09 DIAGNOSIS — M545 Low back pain: Secondary | ICD-10-CM | POA: Diagnosis not present

## 2020-01-11 DIAGNOSIS — M25552 Pain in left hip: Secondary | ICD-10-CM | POA: Diagnosis not present

## 2020-01-11 DIAGNOSIS — M25551 Pain in right hip: Secondary | ICD-10-CM | POA: Diagnosis not present

## 2020-01-11 DIAGNOSIS — R262 Difficulty in walking, not elsewhere classified: Secondary | ICD-10-CM | POA: Diagnosis not present

## 2020-01-11 DIAGNOSIS — M6281 Muscle weakness (generalized): Secondary | ICD-10-CM | POA: Diagnosis not present

## 2020-01-11 DIAGNOSIS — M545 Low back pain: Secondary | ICD-10-CM | POA: Diagnosis not present

## 2020-01-11 DIAGNOSIS — M722 Plantar fascial fibromatosis: Secondary | ICD-10-CM | POA: Diagnosis not present

## 2020-01-18 DIAGNOSIS — M722 Plantar fascial fibromatosis: Secondary | ICD-10-CM | POA: Diagnosis not present

## 2020-01-18 DIAGNOSIS — M71572 Other bursitis, not elsewhere classified, left ankle and foot: Secondary | ICD-10-CM | POA: Diagnosis not present

## 2020-01-23 ENCOUNTER — Ambulatory Visit: Payer: Medicare Other | Admitting: Podiatry

## 2020-01-26 ENCOUNTER — Other Ambulatory Visit: Payer: Self-pay

## 2020-01-26 ENCOUNTER — Encounter: Payer: Self-pay | Admitting: Physician Assistant

## 2020-01-26 ENCOUNTER — Ambulatory Visit (INDEPENDENT_AMBULATORY_CARE_PROVIDER_SITE_OTHER): Payer: Medicare Other | Admitting: Physician Assistant

## 2020-01-26 DIAGNOSIS — B009 Herpesviral infection, unspecified: Secondary | ICD-10-CM | POA: Diagnosis not present

## 2020-01-26 MED ORDER — VALACYCLOVIR HCL 1 G PO TABS: 1000.0000 mg | ORAL_TABLET | Freq: Three times a day (TID) | ORAL | 0 refills | Status: DC

## 2020-01-26 NOTE — Progress Notes (Signed)
   Follow-Up Visit   Subjective  Eric [name] is a 74 y.o. male who presents for the following: Skin Problem (right side for days larger itch and pain no drainage patient thinks +shingles ).    Location: R side Duration: 4 days Quality: red patch Associated Signs/Symptoms: painful Modifying Factors: persistent Severity: stable Timing: pt is stressed     The following portions of the chart were reviewed this encounter and updated as appropriate: Tobacco  Allergies  Meds  Problems  Med Hx  Surg Hx  Fam Hx      Objective  Well appearing patient in no apparent distress; mood and affect are within normal limits.  All skin waist up examined.  Objective  Right Abdomen (side) - Upper: Round pink patch of blisters. No path around the torso   Assessment & Plan  Herpetic lesion Right Abdomen (side) - Upper  Ordered Medications: valACYclovir (VALTREX) 1000 MG tablet

## 2020-02-17 IMAGING — CT CT CHEST HIGH RESOLUTION WITHOUT CONTRAST
2 of 5 series · 15 of 36 positions shown, 18 images · non-contrast
Comparison: 02/25/2017

CLINICAL DATA: Chronic cough, bronchiectasis

EXAM:
CT CHEST WITHOUT CONTRAST
TECHNIQUE: Multidetector CT imaging of the chest was performed following the
standard protocol without intravenous contrast. High resolution
imaging of the lungs, as well as inspiratory and expiratory imaging,
was performed.

[Series 2: high resolution · axial · 0.73mm/px · z∈[-355,-35]mm · 12 of 178 slices shown, 15 images]
[im 9/178  mediastinal]
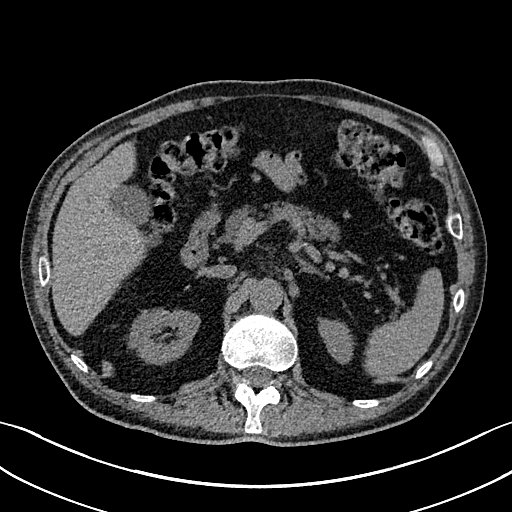
[im 9/178  lung]
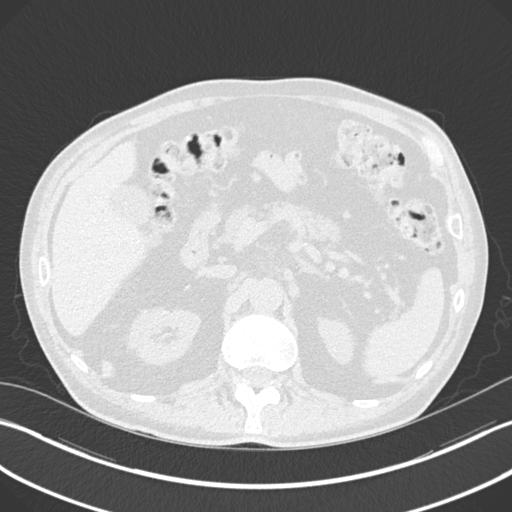
[im 25/178  lung]
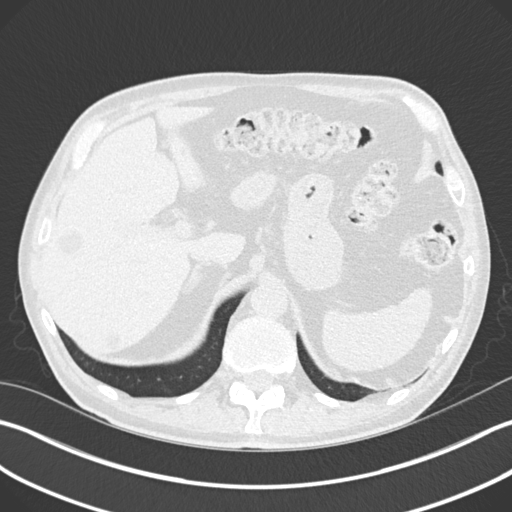
[im 41/178  lung]
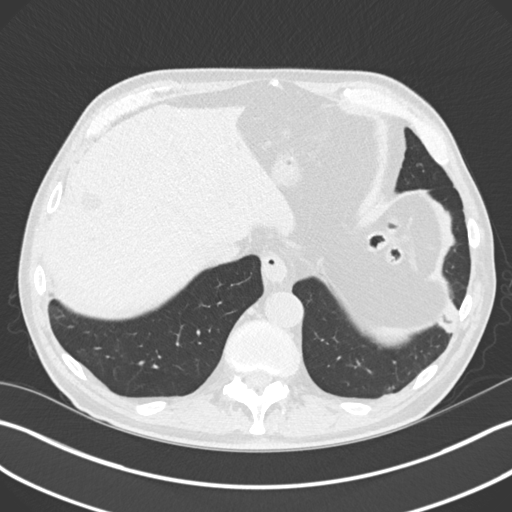
[im 57/178  lung]
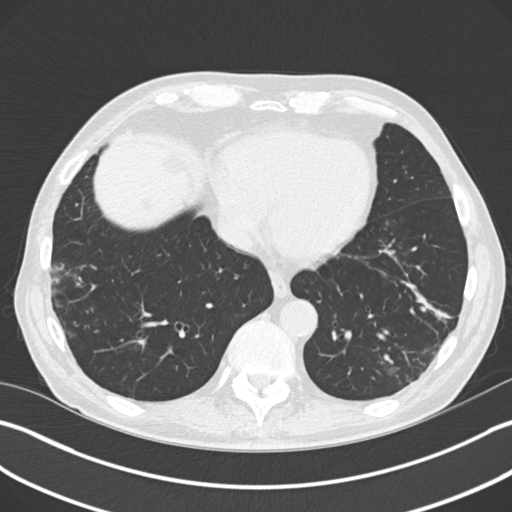
[im 65/178  mediastinal]
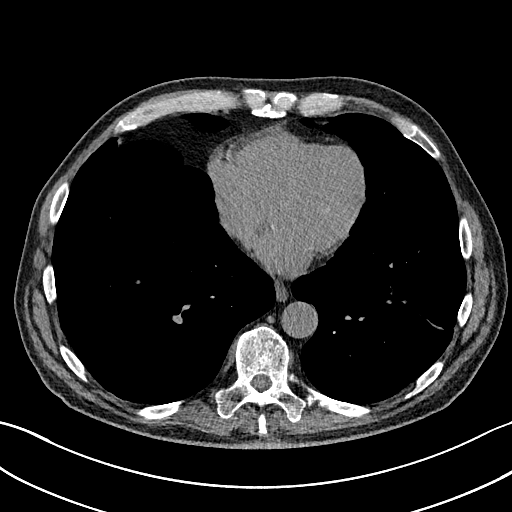
[im 65/178  lung]
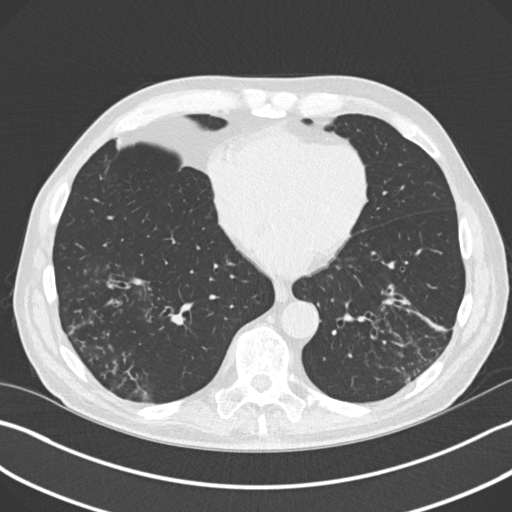
[im 81/178  lung]
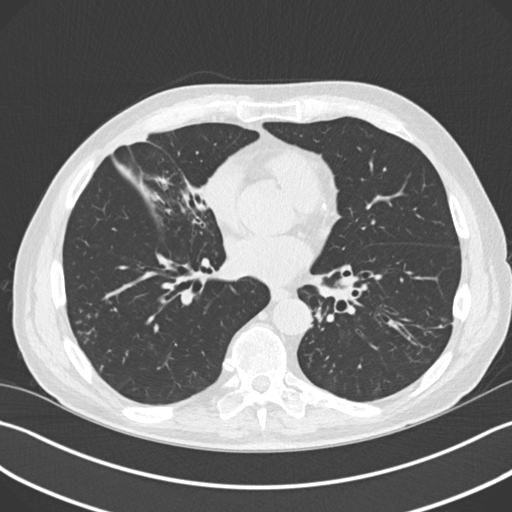
[im 97/178  lung]
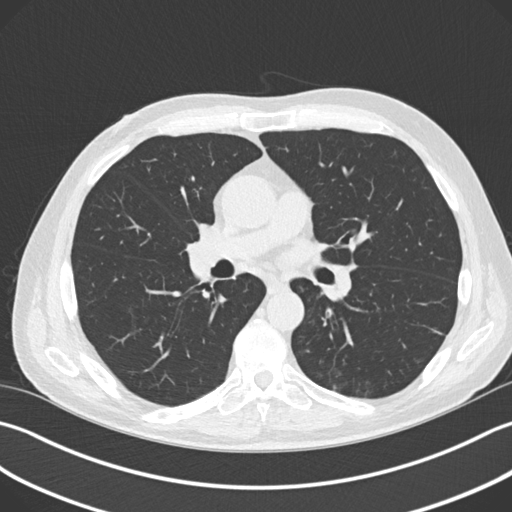
[im 113/178  lung]
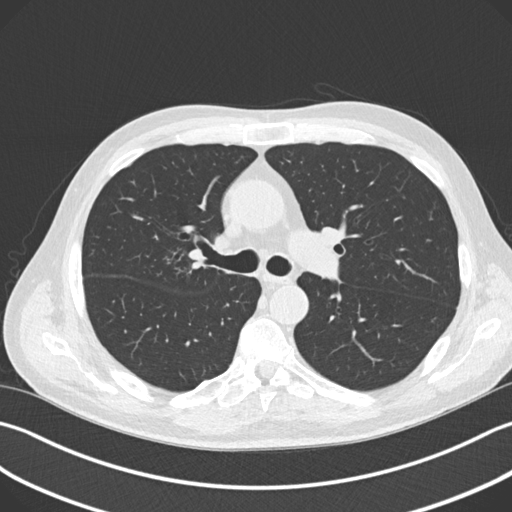
[im 121/178  mediastinal]
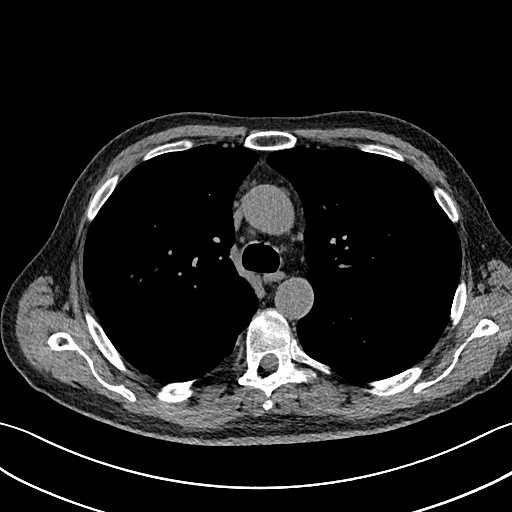
[im 121/178  lung]
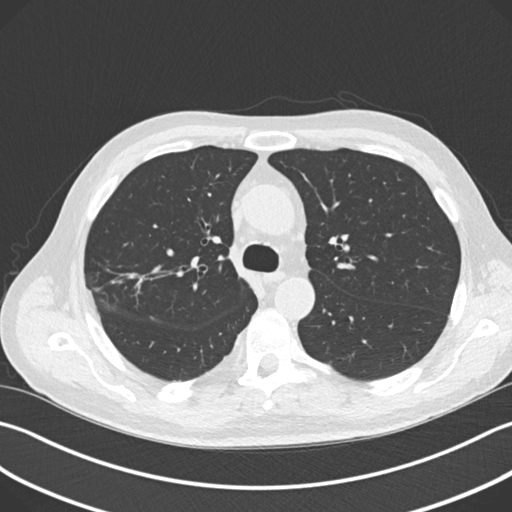
[im 137/178  lung]
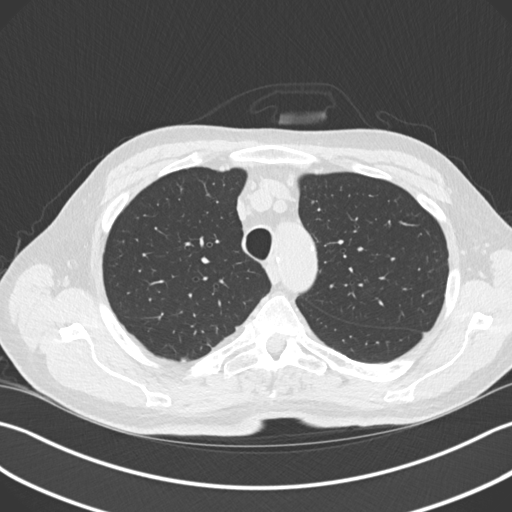
[im 153/178  lung]
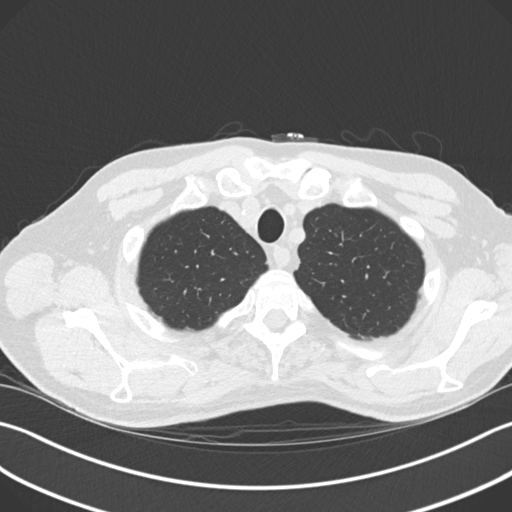
[im 169/178  lung]
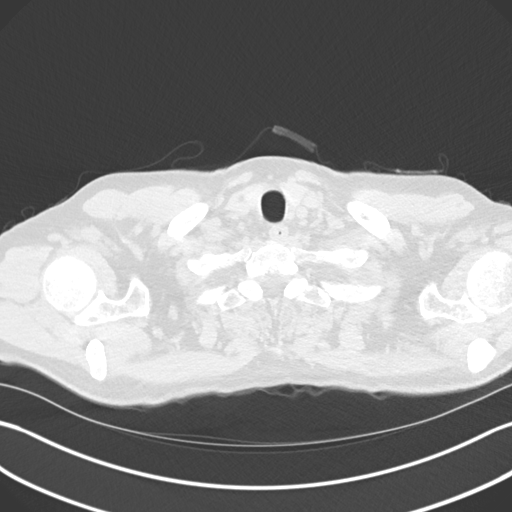

[Series 9: coronal · coronal · 0.69mm/px · 3 of 132 slices shown]
[im 27/132  lung]
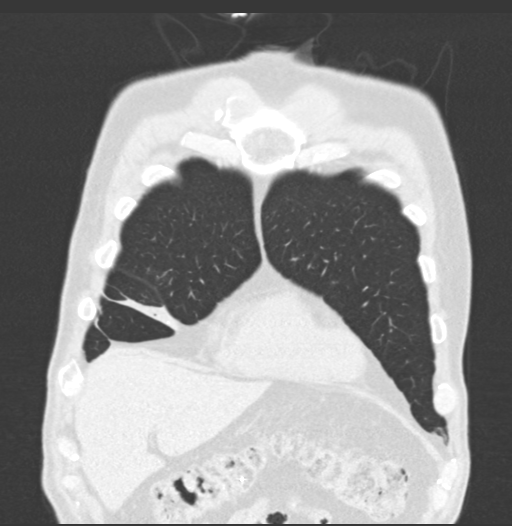
[im 53/132  lung]
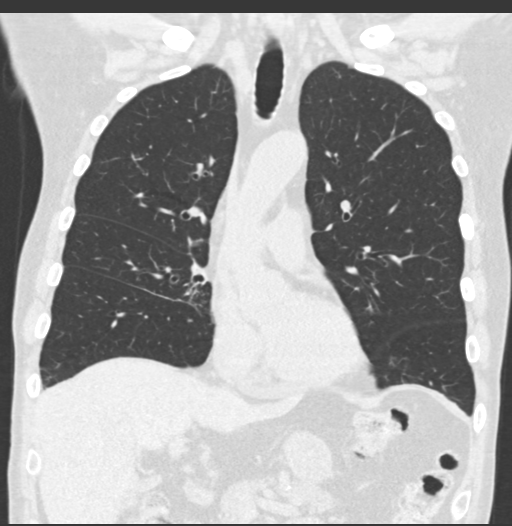
[im 79/132  lung]
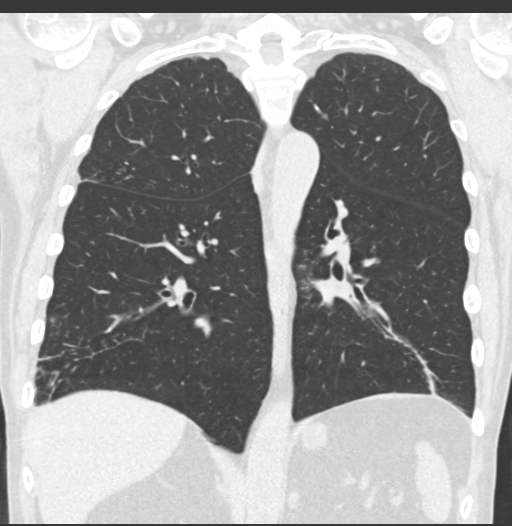

[15 of 36 positions shown; findings below may reference images not displayed]

FINDINGS: Cardiovascular: No significant vascular findings. Normal heart size.
No pericardial effusion.

Mediastinum/Nodes: No enlarged mediastinal, hilar, or axillary lymph
nodes. Thyroid gland, trachea, and esophagus demonstrate no
significant findings.

Lungs/Pleura: There is multifocal tubular bronchiectasis and
scarring associated with clustered centrilobular nodularity, most
conspicuous in the right middle lobe, with very substantial volume
loss. Bronchiectasis is notably worsened in the left lower lobe,
with increased, clustered centrilobular nodularity of the bilateral
lower lobes and peripheral right upper lobe. No pleural effusion or
pneumothorax.

Upper Abdomen: No acute abnormality.

Musculoskeletal: No chest wall mass or suspicious bone lesions
identified.
IMPRESSION: There is multifocal tubular bronchiectasis and scarring associated
with clustered centrilobular nodularity, most conspicuous in the
right middle lobe, with very substantial volume loss. Bronchiectasis
is notably worsened in the left lower lobe, with increased,
clustered centrilobular nodularity of the bilateral lower lobes and
peripheral right upper lobe. Findings are consistent with ongoing,
worsened atypical infection, particularly including atypical
mycobacterium.

## 2020-03-01 DIAGNOSIS — K5900 Constipation, unspecified: Secondary | ICD-10-CM | POA: Diagnosis not present

## 2020-03-01 DIAGNOSIS — M67372 Transient synovitis, left ankle and foot: Secondary | ICD-10-CM | POA: Diagnosis not present

## 2020-03-01 DIAGNOSIS — K6289 Other specified diseases of anus and rectum: Secondary | ICD-10-CM | POA: Diagnosis not present

## 2020-05-02 NOTE — Addendum Note (Signed)
Addended by: Robyne Askew R on: 05/02/2020 11:32 AM   Modules accepted: Level of Service

## 2020-05-10 ENCOUNTER — Other Ambulatory Visit: Payer: Self-pay

## 2020-05-10 ENCOUNTER — Encounter: Payer: Self-pay | Admitting: Podiatry

## 2020-05-10 ENCOUNTER — Ambulatory Visit (INDEPENDENT_AMBULATORY_CARE_PROVIDER_SITE_OTHER): Payer: Medicare Other | Admitting: Podiatry

## 2020-05-10 DIAGNOSIS — M7672 Peroneal tendinitis, left leg: Secondary | ICD-10-CM | POA: Diagnosis not present

## 2020-05-10 DIAGNOSIS — M722 Plantar fascial fibromatosis: Secondary | ICD-10-CM | POA: Diagnosis not present

## 2020-05-10 NOTE — Progress Notes (Signed)
Subjective:   Patient ID: Eric [name], male   DOB: 74 y.o.   MRN: [id number]   HPI Patient states that his heel still bothers him some but he is developed a lot more pain in the outside of his left foot.  States he is having a flareup and is utilizing new sandals which provide some benefit especially the plantar but not the outside   ROS      Objective:  Physical Exam  Neurovascular status intact with acute discomfort in the peroneal tendon insertional point Base of fifth metatarsal left with discomfort in the plantar fascia but not to the same degree     Assessment:  Peroneal tendinitis left with plantar fascial inflammation left improved to a degree     Plan:  H&P reviewed both conditions separately and for the plantar fashion continue supportive shoes topical medicines and stretching exercises.  Explained compensation of the lateral peroneal tendon did sterile prep and injected 3 mg Kenalog 5 mg Xylocaine and advised on ice therapy and anti-inflammatories.  Patient will be seen back and may require orthotics depending on response

## 2020-05-25 ENCOUNTER — Other Ambulatory Visit: Payer: Self-pay | Admitting: Nurse Practitioner

## 2020-05-31 ENCOUNTER — Encounter: Payer: Self-pay | Admitting: Podiatry

## 2020-05-31 ENCOUNTER — Ambulatory Visit (INDEPENDENT_AMBULATORY_CARE_PROVIDER_SITE_OTHER): Payer: Medicare Other | Admitting: Podiatry

## 2020-05-31 ENCOUNTER — Other Ambulatory Visit: Payer: Self-pay

## 2020-05-31 VITALS — Temp 97.3°F

## 2020-05-31 DIAGNOSIS — M7672 Peroneal tendinitis, left leg: Secondary | ICD-10-CM | POA: Diagnosis not present

## 2020-05-31 DIAGNOSIS — M722 Plantar fascial fibromatosis: Secondary | ICD-10-CM

## 2020-05-31 NOTE — Progress Notes (Signed)
Subjective:   Patient ID: Eric [name], male   DOB: 74 y.o.   MRN: [id number]   HPI Patient states that he feels a little bit better but when he flexes his foot he still noting discomfort.  Patient states he has not truly tested it and has not done sports he wants to do   ROS      Objective:  Physical Exam  Neurovascular status intact with patient's left foot improved with pain still noted only upon deep palpation to the fascia or the peroneal tendon     Assessment:  Improved peroneal tendinitis posterior tibial tendinitis plantar fasciitis left     Plan:  Viewed a plan to increase his activity over the next 4 to [redacted] weeks along with topical medications oral medications as needed and physical therapy.  Patient will be seen back to recheck and is encouraged to call with questions prior to this

## 2020-06-14 ENCOUNTER — Encounter: Payer: Self-pay | Admitting: Physician Assistant

## 2020-06-14 ENCOUNTER — Other Ambulatory Visit: Payer: Self-pay

## 2020-06-14 ENCOUNTER — Ambulatory Visit (INDEPENDENT_AMBULATORY_CARE_PROVIDER_SITE_OTHER): Payer: Medicare Other | Admitting: Physician Assistant

## 2020-06-14 DIAGNOSIS — D1800 Hemangioma unspecified site: Secondary | ICD-10-CM | POA: Diagnosis not present

## 2020-06-14 DIAGNOSIS — D229 Melanocytic nevi, unspecified: Secondary | ICD-10-CM | POA: Diagnosis not present

## 2020-06-14 DIAGNOSIS — Z85828 Personal history of other malignant neoplasm of skin: Secondary | ICD-10-CM

## 2020-06-14 DIAGNOSIS — L578 Other skin changes due to chronic exposure to nonionizing radiation: Secondary | ICD-10-CM

## 2020-06-14 DIAGNOSIS — Z1283 Encounter for screening for malignant neoplasm of skin: Secondary | ICD-10-CM | POA: Diagnosis not present

## 2020-06-14 DIAGNOSIS — L570 Actinic keratosis: Secondary | ICD-10-CM | POA: Diagnosis not present

## 2020-06-14 DIAGNOSIS — L820 Inflamed seborrheic keratosis: Secondary | ICD-10-CM | POA: Diagnosis not present

## 2020-06-14 DIAGNOSIS — L821 Other seborrheic keratosis: Secondary | ICD-10-CM | POA: Diagnosis not present

## 2020-06-14 DIAGNOSIS — L814 Other melanin hyperpigmentation: Secondary | ICD-10-CM | POA: Diagnosis not present

## 2020-06-14 NOTE — Progress Notes (Signed)
   Follow-Up Visit   Subjective  Eric [name] is a 74 y.o. male who presents for the following: Lesion (on the right shoulder and left leg).   The following portions of the chart were reviewed this encounter and updated as appropriate: Tobacco  Allergies  Meds  Problems  Med Hx  Surg Hx  Fam Hx      Objective  Well appearing patient in no apparent distress; mood and affect are within normal limits.  A focused examination was performed including waist up and legs. Relevant physical exam findings are noted in the Assessment and Plan.  Objective  Chest - Medial [facility]), Right Upper Back: Scars clear  Objective  waist up and legs: No atypical nevi No signs of non-mole skin cancer.   Objective  Right Breast, left ear rim, right ear rim: Erythematous patches with gritty scale.  Objective  Right Shoulder - Anterior: Erythematous stuck-on, waxy papule or plaque.   Assessment & Plan  History of basal cell carcinoma (BCC) (2) Chest - Medial [facility]); Right Upper Back  observe  Screening exam for skin cancer waist up and legs  Yearly exam  AK (actinic keratosis) (3) Right Breast; left ear rim; right ear rim  Destruction of lesion - Right Breast, left ear rim, right ear rim Complexity: simple   Destruction method: cryotherapy   Informed consent: discussed and consent obtained   Timeout:  patient name, date of birth, surgical site, and procedure verified Lesion destroyed using liquid nitrogen: Yes   Cryotherapy cycles:  1 Outcome: patient tolerated procedure well with no complications   Post-procedure details: wound care instructions given    Seborrheic keratoses, inflamed Right Shoulder - Anterior  observe Lentigines - Scattered tan macules - Discussed due to sun exposure - Benign, observe - Call for any changes  Seborrheic Keratoses - Stuck-on, waxy, tan-brown papules and plaques  - Discussed benign etiology and prognosis. - Observe - Call for any  changes  Melanocytic Nevi - Tan-brown and/or pink-flesh-colored symmetric macules and papules - Benign appearing on exam today - Observation - Call clinic for new or changing moles - Recommend daily use of broad spectrum spf 30+ sunscreen to sun-exposed areas.   Hemangiomas - Red papules - Discussed benign nature - Observe - Call for any changes  Actinic Damage - diffuse scaly erythematous macules with underlying dyspigmentation - Recommend daily broad spectrum sunscreen SPF 30+ to sun-exposed areas, reapply every 2 hours as needed.  - Call for new or changing lesions.  I, Sonia Stickels, PA-C, have reviewed all documentation for this visit. The documentation on 06/14/20 for the exam, diagnosis, procedures, and orders are all accurate and complete.

## 2020-07-16 DIAGNOSIS — Z23 Encounter for immunization: Secondary | ICD-10-CM | POA: Diagnosis not present

## 2020-07-18 DIAGNOSIS — Z23 Encounter for immunization: Secondary | ICD-10-CM | POA: Diagnosis not present

## 2020-09-12 DIAGNOSIS — R1310 Dysphagia, unspecified: Secondary | ICD-10-CM | POA: Diagnosis not present

## 2020-09-12 DIAGNOSIS — K6289 Other specified diseases of anus and rectum: Secondary | ICD-10-CM | POA: Diagnosis not present

## 2020-09-12 DIAGNOSIS — K5900 Constipation, unspecified: Secondary | ICD-10-CM | POA: Diagnosis not present

## 2020-10-16 DIAGNOSIS — K219 Gastro-esophageal reflux disease without esophagitis: Secondary | ICD-10-CM | POA: Diagnosis not present

## 2020-10-16 DIAGNOSIS — R053 Chronic cough: Secondary | ICD-10-CM | POA: Diagnosis not present

## 2020-10-16 DIAGNOSIS — J470 Bronchiectasis with acute lower respiratory infection: Secondary | ICD-10-CM | POA: Diagnosis not present

## 2020-11-07 DIAGNOSIS — I700 Atherosclerosis of aorta: Secondary | ICD-10-CM | POA: Diagnosis not present

## 2020-11-07 DIAGNOSIS — Z0000 Encounter for general adult medical examination without abnormal findings: Secondary | ICD-10-CM | POA: Diagnosis not present

## 2020-11-07 DIAGNOSIS — T23222A Burn of second degree of single left finger (nail) except thumb, initial encounter: Secondary | ICD-10-CM | POA: Diagnosis not present

## 2020-11-07 DIAGNOSIS — J479 Bronchiectasis, uncomplicated: Secondary | ICD-10-CM | POA: Diagnosis not present

## 2020-11-07 DIAGNOSIS — K9089 Other intestinal malabsorption: Secondary | ICD-10-CM | POA: Diagnosis not present

## 2020-11-07 DIAGNOSIS — Z79899 Other long term (current) drug therapy: Secondary | ICD-10-CM | POA: Diagnosis not present

## 2020-11-07 DIAGNOSIS — E7800 Pure hypercholesterolemia, unspecified: Secondary | ICD-10-CM | POA: Diagnosis not present

## 2020-11-07 DIAGNOSIS — Z1389 Encounter for screening for other disorder: Secondary | ICD-10-CM | POA: Diagnosis not present

## 2020-11-07 DIAGNOSIS — K219 Gastro-esophageal reflux disease without esophagitis: Secondary | ICD-10-CM | POA: Diagnosis not present

## 2020-12-20 DIAGNOSIS — Z79899 Other long term (current) drug therapy: Secondary | ICD-10-CM | POA: Diagnosis not present

## 2020-12-20 DIAGNOSIS — I959 Hypotension, unspecified: Secondary | ICD-10-CM | POA: Diagnosis not present

## 2020-12-20 DIAGNOSIS — K219 Gastro-esophageal reflux disease without esophagitis: Secondary | ICD-10-CM | POA: Diagnosis not present

## 2020-12-25 DIAGNOSIS — R110 Nausea: Secondary | ICD-10-CM | POA: Diagnosis not present

## 2020-12-25 DIAGNOSIS — K219 Gastro-esophageal reflux disease without esophagitis: Secondary | ICD-10-CM | POA: Diagnosis not present

## 2020-12-25 DIAGNOSIS — R1310 Dysphagia, unspecified: Secondary | ICD-10-CM | POA: Diagnosis not present

## 2020-12-25 DIAGNOSIS — Z01812 Encounter for preprocedural laboratory examination: Secondary | ICD-10-CM | POA: Diagnosis not present

## 2020-12-26 ENCOUNTER — Other Ambulatory Visit: Payer: Self-pay | Admitting: Gastroenterology

## 2020-12-26 DIAGNOSIS — R110 Nausea: Secondary | ICD-10-CM

## 2020-12-28 DIAGNOSIS — K219 Gastro-esophageal reflux disease without esophagitis: Secondary | ICD-10-CM | POA: Diagnosis not present

## 2020-12-28 DIAGNOSIS — K2980 Duodenitis without bleeding: Secondary | ICD-10-CM | POA: Diagnosis not present

## 2020-12-28 DIAGNOSIS — K449 Diaphragmatic hernia without obstruction or gangrene: Secondary | ICD-10-CM | POA: Diagnosis not present

## 2020-12-28 DIAGNOSIS — K3189 Other diseases of stomach and duodenum: Secondary | ICD-10-CM | POA: Diagnosis not present

## 2020-12-28 DIAGNOSIS — K319 Disease of stomach and duodenum, unspecified: Secondary | ICD-10-CM | POA: Diagnosis not present

## 2021-01-02 DIAGNOSIS — K219 Gastro-esophageal reflux disease without esophagitis: Secondary | ICD-10-CM | POA: Diagnosis not present

## 2021-01-02 DIAGNOSIS — K2980 Duodenitis without bleeding: Secondary | ICD-10-CM | POA: Diagnosis not present

## 2021-01-02 DIAGNOSIS — K319 Disease of stomach and duodenum, unspecified: Secondary | ICD-10-CM | POA: Diagnosis not present

## 2021-01-10 ENCOUNTER — Ambulatory Visit
Admission: RE | Admit: 2021-01-10 | Discharge: 2021-01-10 | Disposition: A | Discharge status: Home / Self Care | Payer: Medicare Other | Source: Ambulatory Visit | Attending: Gastroenterology | Admitting: Gastroenterology

## 2021-01-10 DIAGNOSIS — N281 Cyst of kidney, acquired: Secondary | ICD-10-CM | POA: Diagnosis not present

## 2021-01-10 DIAGNOSIS — K7689 Other specified diseases of liver: Secondary | ICD-10-CM | POA: Diagnosis not present

## 2021-01-10 DIAGNOSIS — N200 Calculus of kidney: Secondary | ICD-10-CM | POA: Diagnosis not present

## 2021-01-10 DIAGNOSIS — R110 Nausea: Secondary | ICD-10-CM

## 2021-01-21 DIAGNOSIS — Z23 Encounter for immunization: Secondary | ICD-10-CM | POA: Diagnosis not present

## 2021-01-22 DIAGNOSIS — M47896 Other spondylosis, lumbar region: Secondary | ICD-10-CM | POA: Diagnosis not present

## 2021-01-22 DIAGNOSIS — M5416 Radiculopathy, lumbar region: Secondary | ICD-10-CM | POA: Diagnosis not present

## 2021-01-22 DIAGNOSIS — M545 Low back pain, unspecified: Secondary | ICD-10-CM | POA: Diagnosis not present

## 2021-01-22 DIAGNOSIS — M4326 Fusion of spine, lumbar region: Secondary | ICD-10-CM | POA: Diagnosis not present

## 2021-01-30 ENCOUNTER — Other Ambulatory Visit: Payer: Self-pay | Admitting: Orthopaedic Surgery

## 2021-01-30 DIAGNOSIS — M5416 Radiculopathy, lumbar region: Secondary | ICD-10-CM

## 2021-02-13 ENCOUNTER — Other Ambulatory Visit: Payer: Self-pay

## 2021-02-13 ENCOUNTER — Ambulatory Visit
Admission: RE | Admit: 2021-02-13 | Discharge: 2021-02-13 | Disposition: A | Discharge status: Home / Self Care | Payer: Medicare Other | Source: Ambulatory Visit | Attending: Orthopaedic Surgery | Admitting: Orthopaedic Surgery

## 2021-02-13 DIAGNOSIS — M5416 Radiculopathy, lumbar region: Secondary | ICD-10-CM

## 2021-02-13 DIAGNOSIS — M545 Low back pain, unspecified: Secondary | ICD-10-CM | POA: Diagnosis not present

## 2021-02-13 DIAGNOSIS — M48061 Spinal stenosis, lumbar region without neurogenic claudication: Secondary | ICD-10-CM | POA: Diagnosis not present

## 2021-02-21 DIAGNOSIS — M461 Sacroiliitis, not elsewhere classified: Secondary | ICD-10-CM | POA: Diagnosis not present

## 2021-02-21 DIAGNOSIS — M47816 Spondylosis without myelopathy or radiculopathy, lumbar region: Secondary | ICD-10-CM | POA: Diagnosis not present

## 2021-02-21 DIAGNOSIS — M4326 Fusion of spine, lumbar region: Secondary | ICD-10-CM | POA: Diagnosis not present

## 2021-02-21 DIAGNOSIS — M5416 Radiculopathy, lumbar region: Secondary | ICD-10-CM | POA: Diagnosis not present

## 2021-02-26 DIAGNOSIS — M5416 Radiculopathy, lumbar region: Secondary | ICD-10-CM | POA: Diagnosis not present

## 2021-03-28 ENCOUNTER — Ambulatory Visit (INDEPENDENT_AMBULATORY_CARE_PROVIDER_SITE_OTHER): Payer: Medicare Other

## 2021-03-28 ENCOUNTER — Ambulatory Visit (INDEPENDENT_AMBULATORY_CARE_PROVIDER_SITE_OTHER): Payer: Medicare Other | Admitting: Podiatry

## 2021-03-28 ENCOUNTER — Other Ambulatory Visit: Payer: Self-pay

## 2021-03-28 DIAGNOSIS — M7752 Other enthesopathy of left foot: Secondary | ICD-10-CM | POA: Diagnosis not present

## 2021-03-28 DIAGNOSIS — M7989 Other specified soft tissue disorders: Secondary | ICD-10-CM

## 2021-03-28 MED ORDER — PREDNISONE 10 MG PO TABS: ORAL_TABLET | ORAL | 0 refills | Status: DC

## 2021-03-28 MED ORDER — TRIAMCINOLONE ACETONIDE 10 MG/ML IJ SUSP
10.0000 mg | Freq: Once | INTRAMUSCULAR | Status: AC
  Administered 2021-03-28: 10 mg

## 2021-03-28 NOTE — Progress Notes (Signed)
Subjective:   Patient ID: Eric [name], male   DOB: 75 y.o.   MRN: [id number]   HPI Patient presents with a lot of pain in his left foot and ankle and also significant swelling of the left foot that is occurred over the last few weeks.  Does not remember specific injury and states it was doing pretty well but has intensified over the last month or 2   ROS      Objective:  Physical Exam  Neurovascular status intact with patient found to have exquisite discomfort in the sinus tarsi left with inflammation fluid and is noted to have swelling with +2 pitting edema negative Homans' sign noted around the left ankle     Assessment:  Acute sinus tarsitis left along with pitting edema localized to the ankle left with no indication of proximal pathology     Plan:  H&P educated him on what to do if any swelling of his leg or any chest symptoms were to occur did sterile prep injected the sinus tarsi left 3 mg Kenalog 5 mg Xylocaine and applied ankle compression stocking left.  Reappoint to recheck  X-rays indicate there is no signs of fracture no signs of loss of arch height or other pathology

## 2021-03-29 ENCOUNTER — Other Ambulatory Visit: Payer: Self-pay | Admitting: Podiatry

## 2021-03-29 DIAGNOSIS — M7752 Other enthesopathy of left foot: Secondary | ICD-10-CM

## 2021-04-01 DIAGNOSIS — K219 Gastro-esophageal reflux disease without esophagitis: Secondary | ICD-10-CM | POA: Diagnosis not present

## 2021-04-01 DIAGNOSIS — B3781 Candidal esophagitis: Secondary | ICD-10-CM | POA: Diagnosis not present

## 2021-04-11 ENCOUNTER — Ambulatory Visit (INDEPENDENT_AMBULATORY_CARE_PROVIDER_SITE_OTHER): Payer: Medicare Other | Admitting: Podiatry

## 2021-04-11 ENCOUNTER — Other Ambulatory Visit: Payer: Self-pay

## 2021-04-11 DIAGNOSIS — M7752 Other enthesopathy of left foot: Secondary | ICD-10-CM

## 2021-04-11 NOTE — Progress Notes (Signed)
Subjective:   Patient ID: Eric [name], male   DOB: 75 y.o.   MRN: [id number]   HPI Not seen   ROS      Objective:  Physical Exam  Not seen     Assessment:  Not seen     Plan:  Had to go to another appointment not seen

## 2021-04-12 ENCOUNTER — Encounter: Payer: Self-pay | Admitting: Podiatry

## 2021-04-12 ENCOUNTER — Ambulatory Visit (INDEPENDENT_AMBULATORY_CARE_PROVIDER_SITE_OTHER): Payer: Medicare Other | Admitting: Podiatry

## 2021-04-12 DIAGNOSIS — M7989 Other specified soft tissue disorders: Secondary | ICD-10-CM | POA: Diagnosis not present

## 2021-04-12 DIAGNOSIS — M7672 Peroneal tendinitis, left leg: Secondary | ICD-10-CM

## 2021-04-12 DIAGNOSIS — M7752 Other enthesopathy of left foot: Secondary | ICD-10-CM

## 2021-04-15 NOTE — Progress Notes (Signed)
Subjective:   Patient ID: Eric [name], male   DOB: 75 y.o.   MRN: [id number]   HPI Patient states he is improving but still having some discomfort and some swelling associated with his foot   ROS      Objective:  Physical Exam  Knee neurovascular status intact with patient found to have continued inflammation but improvement plantar aspect left foot lateral side left foot mild edema with questionable incurvated with no positive Homans' sign noted     Assessment:  Inflammatory tendinitis with improvement with discomfort still present upon deep palpation with swelling still present     Plan:  H&P reviewed condition and recommended compression therapy ice therapy anti-inflammatories shoe gear modifications.  Patient will be seen back to recheck encouraged to call questions concerns especially if worsening of symptoms should occur

## 2021-04-17 ENCOUNTER — Ambulatory Visit
Admission: RE | Admit: 2021-04-17 | Discharge: 2021-04-17 | Disposition: A | Discharge status: Home / Self Care | Payer: Medicare Other | Source: Ambulatory Visit | Attending: Geriatric Medicine | Admitting: Geriatric Medicine

## 2021-04-17 ENCOUNTER — Other Ambulatory Visit: Payer: Self-pay | Admitting: Geriatric Medicine

## 2021-04-17 DIAGNOSIS — K219 Gastro-esophageal reflux disease without esophagitis: Secondary | ICD-10-CM | POA: Diagnosis not present

## 2021-04-17 DIAGNOSIS — R5383 Other fatigue: Secondary | ICD-10-CM | POA: Diagnosis not present

## 2021-04-17 DIAGNOSIS — J189 Pneumonia, unspecified organism: Secondary | ICD-10-CM | POA: Diagnosis not present

## 2021-04-17 DIAGNOSIS — R0609 Other forms of dyspnea: Secondary | ICD-10-CM | POA: Diagnosis not present

## 2021-04-17 DIAGNOSIS — R0602 Shortness of breath: Secondary | ICD-10-CM | POA: Diagnosis not present

## 2021-04-17 DIAGNOSIS — R059 Cough, unspecified: Secondary | ICD-10-CM | POA: Diagnosis not present

## 2021-04-17 DIAGNOSIS — R0789 Other chest pain: Secondary | ICD-10-CM | POA: Diagnosis not present

## 2021-04-17 DIAGNOSIS — R0600 Dyspnea, unspecified: Secondary | ICD-10-CM | POA: Diagnosis not present

## 2021-04-26 DIAGNOSIS — Z20822 Contact with and (suspected) exposure to covid-19: Secondary | ICD-10-CM | POA: Diagnosis not present

## 2021-05-07 DIAGNOSIS — M25512 Pain in left shoulder: Secondary | ICD-10-CM | POA: Diagnosis not present

## 2021-05-29 DIAGNOSIS — M25512 Pain in left shoulder: Secondary | ICD-10-CM | POA: Diagnosis not present

## 2021-07-04 ENCOUNTER — Other Ambulatory Visit: Payer: Self-pay

## 2021-07-04 ENCOUNTER — Encounter: Payer: Self-pay | Admitting: Physician Assistant

## 2021-07-04 ENCOUNTER — Ambulatory Visit (INDEPENDENT_AMBULATORY_CARE_PROVIDER_SITE_OTHER): Payer: Medicare Other | Admitting: Physician Assistant

## 2021-07-04 DIAGNOSIS — Z85828 Personal history of other malignant neoplasm of skin: Secondary | ICD-10-CM | POA: Diagnosis not present

## 2021-07-04 DIAGNOSIS — B078 Other viral warts: Secondary | ICD-10-CM

## 2021-07-04 DIAGNOSIS — B079 Viral wart, unspecified: Secondary | ICD-10-CM

## 2021-07-04 DIAGNOSIS — D485 Neoplasm of uncertain behavior of skin: Secondary | ICD-10-CM

## 2021-07-04 DIAGNOSIS — Z1283 Encounter for screening for malignant neoplasm of skin: Secondary | ICD-10-CM | POA: Diagnosis not present

## 2021-07-04 NOTE — Patient Instructions (Signed)

## 2021-07-04 NOTE — Progress Notes (Signed)
   Follow-Up Visit   Subjective  Eric [name] is a 75 y.o. male who presents for the following: Skin Problem (Here to have 2 lesions checked on left thigh. He picks them a lot. They irritate patient he wants them removed. History of non mole skin cancers. ).   The following portions of the chart were reviewed this encounter and updated as appropriate:  Tobacco  Allergies  Meds  Problems  Med Hx  Surg Hx  Fam Hx      Objective  Well appearing patient in no apparent distress; mood and affect are within normal limits.  A full examination was performed including scalp, head, eyes, ears, nose, lips, neck, chest, axillae, abdomen, back, buttocks, bilateral upper extremities, bilateral lower extremities, hands, feet, fingers, toes, fingernails, and toenails. All findings within normal limits unless otherwise noted below.  Left Thigh - Anterior Hyperkeratotic scale with pink base        Left Medial Thigh Hyperkeratotic scale with pink base        Assessment & Plan  Neoplasm of uncertain behavior of skin (2) Left Thigh - Anterior  Skin / nail biopsy Type of biopsy: tangential   Informed consent: discussed and consent obtained   Timeout: patient name, date of birth, surgical site, and procedure verified   Procedure prep:  Patient was prepped and draped in usual sterile fashion (Non sterile) Prep type:  Chlorhexidine Anesthesia: the lesion was anesthetized in a standard fashion   Anesthetic:  1% lidocaine w/ epinephrine 1-100,000 local infiltration Instrument used: flexible razor blade   Hemostasis achieved with: aluminum chloride and electrodesiccation   Outcome: patient tolerated procedure well   Post-procedure details: sterile dressing applied and wound care instructions given   Dressing type: bandage and petrolatum    Specimen 1 - Surgical pathology Differential Diagnosis: R/O ISK - cautery after biopsy  Check Margins: No  Left Medial Thigh  Skin / nail  biopsy Type of biopsy: tangential   Informed consent: discussed and consent obtained   Timeout: patient name, date of birth, surgical site, and procedure verified   Procedure prep:  Patient was prepped and draped in usual sterile fashion (Non sterile) Prep type:  Chlorhexidine Anesthesia: the lesion was anesthetized in a standard fashion   Anesthetic:  1% lidocaine w/ epinephrine 1-100,000 local infiltration Instrument used: flexible razor blade   Hemostasis achieved with: aluminum chloride and electrodesiccation   Outcome: patient tolerated procedure well   Post-procedure details: sterile dressing applied and wound care instructions given   Dressing type: bandage and petrolatum    Specimen 2 - Surgical pathology Differential Diagnosis: R/O ISK - cautery after biopsy  Check Margins: No   I, Nicholas Trompeter, PA-C, have reviewed all documentation's for this visit.  The documentation on 07/04/21 for the exam, diagnosis, procedures and orders are all accurate and complete.

## 2021-07-19 DIAGNOSIS — Z23 Encounter for immunization: Secondary | ICD-10-CM | POA: Diagnosis not present

## 2021-09-19 DIAGNOSIS — Z20822 Contact with and (suspected) exposure to covid-19: Secondary | ICD-10-CM | POA: Diagnosis not present

## 2021-10-17 DIAGNOSIS — K219 Gastro-esophageal reflux disease without esophagitis: Secondary | ICD-10-CM | POA: Diagnosis not present

## 2021-10-17 DIAGNOSIS — J470 Bronchiectasis with acute lower respiratory infection: Secondary | ICD-10-CM | POA: Diagnosis not present

## 2021-10-17 DIAGNOSIS — R053 Chronic cough: Secondary | ICD-10-CM | POA: Diagnosis not present

## 2021-10-24 DIAGNOSIS — Z6823 Body mass index (BMI) 23.0-23.9, adult: Secondary | ICD-10-CM | POA: Diagnosis not present

## 2021-10-24 DIAGNOSIS — M5416 Radiculopathy, lumbar region: Secondary | ICD-10-CM | POA: Diagnosis not present

## 2021-11-15 DIAGNOSIS — E7800 Pure hypercholesterolemia, unspecified: Secondary | ICD-10-CM | POA: Diagnosis not present

## 2021-11-15 DIAGNOSIS — Z0000 Encounter for general adult medical examination without abnormal findings: Secondary | ICD-10-CM | POA: Diagnosis not present

## 2021-11-15 DIAGNOSIS — K219 Gastro-esophageal reflux disease without esophagitis: Secondary | ICD-10-CM | POA: Diagnosis not present

## 2021-11-15 DIAGNOSIS — Z1389 Encounter for screening for other disorder: Secondary | ICD-10-CM | POA: Diagnosis not present

## 2021-11-15 DIAGNOSIS — F5101 Primary insomnia: Secondary | ICD-10-CM | POA: Diagnosis not present

## 2021-11-15 DIAGNOSIS — I700 Atherosclerosis of aorta: Secondary | ICD-10-CM | POA: Diagnosis not present

## 2021-11-15 DIAGNOSIS — J479 Bronchiectasis, uncomplicated: Secondary | ICD-10-CM | POA: Diagnosis not present

## 2021-11-15 DIAGNOSIS — M5442 Lumbago with sciatica, left side: Secondary | ICD-10-CM | POA: Diagnosis not present

## 2021-11-15 DIAGNOSIS — Z79899 Other long term (current) drug therapy: Secondary | ICD-10-CM | POA: Diagnosis not present

## 2021-11-15 DIAGNOSIS — G8929 Other chronic pain: Secondary | ICD-10-CM | POA: Diagnosis not present

## 2021-11-21 DIAGNOSIS — Z6823 Body mass index (BMI) 23.0-23.9, adult: Secondary | ICD-10-CM | POA: Diagnosis not present

## 2021-11-21 DIAGNOSIS — M4326 Fusion of spine, lumbar region: Secondary | ICD-10-CM | POA: Diagnosis not present

## 2021-11-21 DIAGNOSIS — M5416 Radiculopathy, lumbar region: Secondary | ICD-10-CM | POA: Diagnosis not present

## 2021-11-22 ENCOUNTER — Other Ambulatory Visit: Payer: Self-pay | Admitting: Orthopaedic Surgery

## 2021-11-22 DIAGNOSIS — M5416 Radiculopathy, lumbar region: Secondary | ICD-10-CM

## 2021-11-28 ENCOUNTER — Other Ambulatory Visit: Payer: Self-pay

## 2021-11-28 ENCOUNTER — Ambulatory Visit
Admission: RE | Admit: 2021-11-28 | Discharge: 2021-11-28 | Disposition: A | Discharge status: Home / Self Care | Payer: Medicare Other | Source: Ambulatory Visit | Attending: Orthopaedic Surgery | Admitting: Orthopaedic Surgery

## 2021-11-28 DIAGNOSIS — M4327 Fusion of spine, lumbosacral region: Secondary | ICD-10-CM | POA: Diagnosis not present

## 2021-11-28 DIAGNOSIS — M48061 Spinal stenosis, lumbar region without neurogenic claudication: Secondary | ICD-10-CM | POA: Diagnosis not present

## 2021-11-28 DIAGNOSIS — M545 Low back pain, unspecified: Secondary | ICD-10-CM | POA: Diagnosis not present

## 2021-11-28 DIAGNOSIS — M5416 Radiculopathy, lumbar region: Secondary | ICD-10-CM

## 2021-12-06 DIAGNOSIS — M5416 Radiculopathy, lumbar region: Secondary | ICD-10-CM | POA: Diagnosis not present

## 2021-12-06 DIAGNOSIS — M4326 Fusion of spine, lumbar region: Secondary | ICD-10-CM | POA: Diagnosis not present

## 2021-12-06 DIAGNOSIS — M542 Cervicalgia: Secondary | ICD-10-CM | POA: Diagnosis not present

## 2021-12-06 DIAGNOSIS — Z6823 Body mass index (BMI) 23.0-23.9, adult: Secondary | ICD-10-CM | POA: Diagnosis not present

## 2021-12-11 DIAGNOSIS — M62552 Muscle wasting and atrophy, not elsewhere classified, left thigh: Secondary | ICD-10-CM | POA: Diagnosis not present

## 2021-12-11 DIAGNOSIS — M542 Cervicalgia: Secondary | ICD-10-CM | POA: Diagnosis not present

## 2021-12-11 DIAGNOSIS — M62551 Muscle wasting and atrophy, not elsewhere classified, right thigh: Secondary | ICD-10-CM | POA: Diagnosis not present

## 2021-12-11 DIAGNOSIS — M545 Low back pain, unspecified: Secondary | ICD-10-CM | POA: Diagnosis not present

## 2021-12-11 DIAGNOSIS — M2569 Stiffness of other specified joint, not elsewhere classified: Secondary | ICD-10-CM | POA: Diagnosis not present

## 2021-12-12 DIAGNOSIS — M545 Low back pain, unspecified: Secondary | ICD-10-CM | POA: Diagnosis not present

## 2021-12-12 DIAGNOSIS — M542 Cervicalgia: Secondary | ICD-10-CM | POA: Diagnosis not present

## 2021-12-12 DIAGNOSIS — M2569 Stiffness of other specified joint, not elsewhere classified: Secondary | ICD-10-CM | POA: Diagnosis not present

## 2021-12-12 DIAGNOSIS — M62552 Muscle wasting and atrophy, not elsewhere classified, left thigh: Secondary | ICD-10-CM | POA: Diagnosis not present

## 2021-12-12 DIAGNOSIS — M62551 Muscle wasting and atrophy, not elsewhere classified, right thigh: Secondary | ICD-10-CM | POA: Diagnosis not present

## 2021-12-23 DIAGNOSIS — M62551 Muscle wasting and atrophy, not elsewhere classified, right thigh: Secondary | ICD-10-CM | POA: Diagnosis not present

## 2021-12-23 DIAGNOSIS — M545 Low back pain, unspecified: Secondary | ICD-10-CM | POA: Diagnosis not present

## 2021-12-23 DIAGNOSIS — M62552 Muscle wasting and atrophy, not elsewhere classified, left thigh: Secondary | ICD-10-CM | POA: Diagnosis not present

## 2021-12-23 DIAGNOSIS — M2569 Stiffness of other specified joint, not elsewhere classified: Secondary | ICD-10-CM | POA: Diagnosis not present

## 2021-12-23 DIAGNOSIS — M542 Cervicalgia: Secondary | ICD-10-CM | POA: Diagnosis not present

## 2021-12-26 DIAGNOSIS — M542 Cervicalgia: Secondary | ICD-10-CM | POA: Diagnosis not present

## 2021-12-26 DIAGNOSIS — M2569 Stiffness of other specified joint, not elsewhere classified: Secondary | ICD-10-CM | POA: Diagnosis not present

## 2021-12-26 DIAGNOSIS — M62552 Muscle wasting and atrophy, not elsewhere classified, left thigh: Secondary | ICD-10-CM | POA: Diagnosis not present

## 2021-12-26 DIAGNOSIS — M62551 Muscle wasting and atrophy, not elsewhere classified, right thigh: Secondary | ICD-10-CM | POA: Diagnosis not present

## 2021-12-26 DIAGNOSIS — M545 Low back pain, unspecified: Secondary | ICD-10-CM | POA: Diagnosis not present

## 2021-12-27 DIAGNOSIS — Z20822 Contact with and (suspected) exposure to covid-19: Secondary | ICD-10-CM | POA: Diagnosis not present

## 2021-12-28 DIAGNOSIS — Z20822 Contact with and (suspected) exposure to covid-19: Secondary | ICD-10-CM | POA: Diagnosis not present

## 2021-12-30 DIAGNOSIS — M2569 Stiffness of other specified joint, not elsewhere classified: Secondary | ICD-10-CM | POA: Diagnosis not present

## 2021-12-30 DIAGNOSIS — M542 Cervicalgia: Secondary | ICD-10-CM | POA: Diagnosis not present

## 2021-12-30 DIAGNOSIS — M62551 Muscle wasting and atrophy, not elsewhere classified, right thigh: Secondary | ICD-10-CM | POA: Diagnosis not present

## 2021-12-30 DIAGNOSIS — M545 Low back pain, unspecified: Secondary | ICD-10-CM | POA: Diagnosis not present

## 2021-12-30 DIAGNOSIS — M62552 Muscle wasting and atrophy, not elsewhere classified, left thigh: Secondary | ICD-10-CM | POA: Diagnosis not present

## 2022-01-02 DIAGNOSIS — M545 Low back pain, unspecified: Secondary | ICD-10-CM | POA: Diagnosis not present

## 2022-01-02 DIAGNOSIS — M62552 Muscle wasting and atrophy, not elsewhere classified, left thigh: Secondary | ICD-10-CM | POA: Diagnosis not present

## 2022-01-02 DIAGNOSIS — M2569 Stiffness of other specified joint, not elsewhere classified: Secondary | ICD-10-CM | POA: Diagnosis not present

## 2022-01-02 DIAGNOSIS — M542 Cervicalgia: Secondary | ICD-10-CM | POA: Diagnosis not present

## 2022-01-02 DIAGNOSIS — M62551 Muscle wasting and atrophy, not elsewhere classified, right thigh: Secondary | ICD-10-CM | POA: Diagnosis not present

## 2022-01-06 DIAGNOSIS — M545 Low back pain, unspecified: Secondary | ICD-10-CM | POA: Diagnosis not present

## 2022-01-06 DIAGNOSIS — M62551 Muscle wasting and atrophy, not elsewhere classified, right thigh: Secondary | ICD-10-CM | POA: Diagnosis not present

## 2022-01-06 DIAGNOSIS — M542 Cervicalgia: Secondary | ICD-10-CM | POA: Diagnosis not present

## 2022-01-06 DIAGNOSIS — M62552 Muscle wasting and atrophy, not elsewhere classified, left thigh: Secondary | ICD-10-CM | POA: Diagnosis not present

## 2022-01-06 DIAGNOSIS — M2569 Stiffness of other specified joint, not elsewhere classified: Secondary | ICD-10-CM | POA: Diagnosis not present

## 2022-01-09 DIAGNOSIS — M545 Low back pain, unspecified: Secondary | ICD-10-CM | POA: Diagnosis not present

## 2022-01-09 DIAGNOSIS — M542 Cervicalgia: Secondary | ICD-10-CM | POA: Diagnosis not present

## 2022-01-09 DIAGNOSIS — M62552 Muscle wasting and atrophy, not elsewhere classified, left thigh: Secondary | ICD-10-CM | POA: Diagnosis not present

## 2022-01-09 DIAGNOSIS — M62551 Muscle wasting and atrophy, not elsewhere classified, right thigh: Secondary | ICD-10-CM | POA: Diagnosis not present

## 2022-01-09 DIAGNOSIS — M2569 Stiffness of other specified joint, not elsewhere classified: Secondary | ICD-10-CM | POA: Diagnosis not present

## 2022-01-14 DIAGNOSIS — M62552 Muscle wasting and atrophy, not elsewhere classified, left thigh: Secondary | ICD-10-CM | POA: Diagnosis not present

## 2022-01-14 DIAGNOSIS — M62551 Muscle wasting and atrophy, not elsewhere classified, right thigh: Secondary | ICD-10-CM | POA: Diagnosis not present

## 2022-01-14 DIAGNOSIS — M2569 Stiffness of other specified joint, not elsewhere classified: Secondary | ICD-10-CM | POA: Diagnosis not present

## 2022-01-14 DIAGNOSIS — M545 Low back pain, unspecified: Secondary | ICD-10-CM | POA: Diagnosis not present

## 2022-01-14 DIAGNOSIS — M542 Cervicalgia: Secondary | ICD-10-CM | POA: Diagnosis not present

## 2022-01-17 DIAGNOSIS — R051 Acute cough: Secondary | ICD-10-CM | POA: Diagnosis not present

## 2022-01-17 DIAGNOSIS — R059 Cough, unspecified: Secondary | ICD-10-CM | POA: Diagnosis not present

## 2022-01-17 DIAGNOSIS — Z20822 Contact with and (suspected) exposure to covid-19: Secondary | ICD-10-CM | POA: Diagnosis not present

## 2022-01-20 ENCOUNTER — Other Ambulatory Visit: Payer: Self-pay | Admitting: Gastroenterology

## 2022-01-20 DIAGNOSIS — M2569 Stiffness of other specified joint, not elsewhere classified: Secondary | ICD-10-CM | POA: Diagnosis not present

## 2022-01-20 DIAGNOSIS — N281 Cyst of kidney, acquired: Secondary | ICD-10-CM

## 2022-01-20 DIAGNOSIS — M62552 Muscle wasting and atrophy, not elsewhere classified, left thigh: Secondary | ICD-10-CM | POA: Diagnosis not present

## 2022-01-20 DIAGNOSIS — K7689 Other specified diseases of liver: Secondary | ICD-10-CM

## 2022-01-20 DIAGNOSIS — M62551 Muscle wasting and atrophy, not elsewhere classified, right thigh: Secondary | ICD-10-CM | POA: Diagnosis not present

## 2022-01-20 DIAGNOSIS — M545 Low back pain, unspecified: Secondary | ICD-10-CM | POA: Diagnosis not present

## 2022-01-20 DIAGNOSIS — M542 Cervicalgia: Secondary | ICD-10-CM | POA: Diagnosis not present

## 2022-01-27 ENCOUNTER — Ambulatory Visit
Admission: RE | Admit: 2022-01-27 | Discharge: 2022-01-27 | Disposition: A | Discharge status: Home / Self Care | Payer: Medicare Other | Source: Ambulatory Visit | Attending: Gastroenterology | Admitting: Gastroenterology

## 2022-01-27 DIAGNOSIS — N200 Calculus of kidney: Secondary | ICD-10-CM | POA: Diagnosis not present

## 2022-01-27 DIAGNOSIS — K7689 Other specified diseases of liver: Secondary | ICD-10-CM

## 2022-01-27 DIAGNOSIS — N281 Cyst of kidney, acquired: Secondary | ICD-10-CM

## 2022-01-27 DIAGNOSIS — Z20822 Contact with and (suspected) exposure to covid-19: Secondary | ICD-10-CM | POA: Diagnosis not present

## 2022-01-27 DIAGNOSIS — K760 Fatty (change of) liver, not elsewhere classified: Secondary | ICD-10-CM | POA: Diagnosis not present

## 2022-01-28 DIAGNOSIS — M545 Low back pain, unspecified: Secondary | ICD-10-CM | POA: Diagnosis not present

## 2022-01-28 DIAGNOSIS — M62551 Muscle wasting and atrophy, not elsewhere classified, right thigh: Secondary | ICD-10-CM | POA: Diagnosis not present

## 2022-01-28 DIAGNOSIS — M62552 Muscle wasting and atrophy, not elsewhere classified, left thigh: Secondary | ICD-10-CM | POA: Diagnosis not present

## 2022-01-28 DIAGNOSIS — M2569 Stiffness of other specified joint, not elsewhere classified: Secondary | ICD-10-CM | POA: Diagnosis not present

## 2022-01-28 DIAGNOSIS — M542 Cervicalgia: Secondary | ICD-10-CM | POA: Diagnosis not present

## 2022-01-29 DIAGNOSIS — Z79899 Other long term (current) drug therapy: Secondary | ICD-10-CM | POA: Diagnosis not present

## 2022-02-03 DIAGNOSIS — M542 Cervicalgia: Secondary | ICD-10-CM | POA: Diagnosis not present

## 2022-02-03 DIAGNOSIS — M545 Low back pain, unspecified: Secondary | ICD-10-CM | POA: Diagnosis not present

## 2022-02-03 DIAGNOSIS — M2569 Stiffness of other specified joint, not elsewhere classified: Secondary | ICD-10-CM | POA: Diagnosis not present

## 2022-02-03 DIAGNOSIS — M62551 Muscle wasting and atrophy, not elsewhere classified, right thigh: Secondary | ICD-10-CM | POA: Diagnosis not present

## 2022-02-03 DIAGNOSIS — M62552 Muscle wasting and atrophy, not elsewhere classified, left thigh: Secondary | ICD-10-CM | POA: Diagnosis not present

## 2022-02-04 DIAGNOSIS — Z20828 Contact with and (suspected) exposure to other viral communicable diseases: Secondary | ICD-10-CM | POA: Diagnosis not present

## 2022-02-11 DIAGNOSIS — M2569 Stiffness of other specified joint, not elsewhere classified: Secondary | ICD-10-CM | POA: Diagnosis not present

## 2022-02-11 DIAGNOSIS — M62551 Muscle wasting and atrophy, not elsewhere classified, right thigh: Secondary | ICD-10-CM | POA: Diagnosis not present

## 2022-02-11 DIAGNOSIS — M545 Low back pain, unspecified: Secondary | ICD-10-CM | POA: Diagnosis not present

## 2022-02-11 DIAGNOSIS — M542 Cervicalgia: Secondary | ICD-10-CM | POA: Diagnosis not present

## 2022-02-11 DIAGNOSIS — M62552 Muscle wasting and atrophy, not elsewhere classified, left thigh: Secondary | ICD-10-CM | POA: Diagnosis not present

## 2022-02-15 DIAGNOSIS — Z20822 Contact with and (suspected) exposure to covid-19: Secondary | ICD-10-CM | POA: Diagnosis not present

## 2022-02-25 DIAGNOSIS — M2569 Stiffness of other specified joint, not elsewhere classified: Secondary | ICD-10-CM | POA: Diagnosis not present

## 2022-02-25 DIAGNOSIS — M62552 Muscle wasting and atrophy, not elsewhere classified, left thigh: Secondary | ICD-10-CM | POA: Diagnosis not present

## 2022-02-25 DIAGNOSIS — M542 Cervicalgia: Secondary | ICD-10-CM | POA: Diagnosis not present

## 2022-02-25 DIAGNOSIS — M62551 Muscle wasting and atrophy, not elsewhere classified, right thigh: Secondary | ICD-10-CM | POA: Diagnosis not present

## 2022-02-25 DIAGNOSIS — M545 Low back pain, unspecified: Secondary | ICD-10-CM | POA: Diagnosis not present

## 2022-03-05 DIAGNOSIS — M5416 Radiculopathy, lumbar region: Secondary | ICD-10-CM | POA: Diagnosis not present

## 2022-03-05 DIAGNOSIS — Z6823 Body mass index (BMI) 23.0-23.9, adult: Secondary | ICD-10-CM | POA: Diagnosis not present

## 2022-03-05 DIAGNOSIS — M4326 Fusion of spine, lumbar region: Secondary | ICD-10-CM | POA: Diagnosis not present

## 2022-03-05 DIAGNOSIS — M542 Cervicalgia: Secondary | ICD-10-CM | POA: Diagnosis not present

## 2022-03-11 DIAGNOSIS — M2569 Stiffness of other specified joint, not elsewhere classified: Secondary | ICD-10-CM | POA: Diagnosis not present

## 2022-03-11 DIAGNOSIS — M545 Low back pain, unspecified: Secondary | ICD-10-CM | POA: Diagnosis not present

## 2022-03-11 DIAGNOSIS — M542 Cervicalgia: Secondary | ICD-10-CM | POA: Diagnosis not present

## 2022-03-11 DIAGNOSIS — M62551 Muscle wasting and atrophy, not elsewhere classified, right thigh: Secondary | ICD-10-CM | POA: Diagnosis not present

## 2022-03-11 DIAGNOSIS — M62552 Muscle wasting and atrophy, not elsewhere classified, left thigh: Secondary | ICD-10-CM | POA: Diagnosis not present

## 2022-03-25 DIAGNOSIS — M542 Cervicalgia: Secondary | ICD-10-CM | POA: Diagnosis not present

## 2022-03-25 DIAGNOSIS — M545 Low back pain, unspecified: Secondary | ICD-10-CM | POA: Diagnosis not present

## 2022-03-25 DIAGNOSIS — M2569 Stiffness of other specified joint, not elsewhere classified: Secondary | ICD-10-CM | POA: Diagnosis not present

## 2022-03-25 DIAGNOSIS — M62552 Muscle wasting and atrophy, not elsewhere classified, left thigh: Secondary | ICD-10-CM | POA: Diagnosis not present

## 2022-03-25 DIAGNOSIS — M62551 Muscle wasting and atrophy, not elsewhere classified, right thigh: Secondary | ICD-10-CM | POA: Diagnosis not present

## 2022-04-02 DIAGNOSIS — L299 Pruritus, unspecified: Secondary | ICD-10-CM | POA: Diagnosis not present

## 2022-04-02 DIAGNOSIS — Z9189 Other specified personal risk factors, not elsewhere classified: Secondary | ICD-10-CM | POA: Diagnosis not present

## 2022-04-08 DIAGNOSIS — M2569 Stiffness of other specified joint, not elsewhere classified: Secondary | ICD-10-CM | POA: Diagnosis not present

## 2022-04-08 DIAGNOSIS — M62552 Muscle wasting and atrophy, not elsewhere classified, left thigh: Secondary | ICD-10-CM | POA: Diagnosis not present

## 2022-04-08 DIAGNOSIS — M545 Low back pain, unspecified: Secondary | ICD-10-CM | POA: Diagnosis not present

## 2022-04-08 DIAGNOSIS — M62551 Muscle wasting and atrophy, not elsewhere classified, right thigh: Secondary | ICD-10-CM | POA: Diagnosis not present

## 2022-04-08 DIAGNOSIS — M542 Cervicalgia: Secondary | ICD-10-CM | POA: Diagnosis not present

## 2022-05-15 ENCOUNTER — Other Ambulatory Visit: Payer: Self-pay

## 2022-05-15 ENCOUNTER — Emergency Department (HOSPITAL_COMMUNITY)
Admission: EM | Admit: 2022-05-15 | Discharge: 2022-05-15 | Discharge status: Against Medical Advice | Payer: Medicare Other | Attending: Emergency Medicine | Admitting: Emergency Medicine

## 2022-05-15 ENCOUNTER — Encounter (HOSPITAL_COMMUNITY): Payer: Self-pay

## 2022-05-15 ENCOUNTER — Emergency Department (HOSPITAL_COMMUNITY): Payer: Medicare Other

## 2022-05-15 DIAGNOSIS — R0602 Shortness of breath: Secondary | ICD-10-CM | POA: Insufficient documentation

## 2022-05-15 DIAGNOSIS — R079 Chest pain, unspecified: Secondary | ICD-10-CM | POA: Insufficient documentation

## 2022-05-15 DIAGNOSIS — R531 Weakness: Secondary | ICD-10-CM | POA: Diagnosis not present

## 2022-05-15 DIAGNOSIS — Z5321 Procedure and treatment not carried out due to patient leaving prior to being seen by health care provider: Secondary | ICD-10-CM | POA: Diagnosis not present

## 2022-05-15 DIAGNOSIS — R059 Cough, unspecified: Secondary | ICD-10-CM | POA: Diagnosis not present

## 2022-05-15 DIAGNOSIS — R0981 Nasal congestion: Secondary | ICD-10-CM | POA: Insufficient documentation

## 2022-05-15 DIAGNOSIS — R058 Other specified cough: Secondary | ICD-10-CM | POA: Diagnosis not present

## 2022-05-15 DIAGNOSIS — R42 Dizziness and giddiness: Secondary | ICD-10-CM | POA: Diagnosis not present

## 2022-05-15 DIAGNOSIS — R5383 Other fatigue: Secondary | ICD-10-CM | POA: Diagnosis not present

## 2022-05-15 LAB — BASIC METABOLIC PANEL
Anion gap: 9 (ref 5–15)
BUN: 18 mg/dL (ref 8–23)
CO2: 25 mmol/L (ref 22–32)
Calcium: 9.6 mg/dL (ref 8.9–10.3)
Chloride: 107 mmol/L (ref 98–111)
Creatinine, Ser: 1.13 mg/dL (ref 0.61–1.24)
GFR, Estimated: >60 mL/min (ref >60)
Glucose, Bld: 107 mg/dL — ABNORMAL HIGH (ref 70–99)
Potassium: 4 mmol/L (ref 3.5–5.1)
Sodium: 141 mmol/L (ref 135–145)

## 2022-05-15 LAB — CBC
HCT: 45.1 % (ref 39.0–52.0)
Hemoglobin: 15.4 g/dL (ref 13.0–17.0)
MCH: 30.6 pg (ref 26.0–34.0)
MCHC: 34.1 g/dL (ref 30.0–36.0)
MCV: 89.5 fL (ref 80.0–100.0)
Platelets: 245 10*3/uL (ref 150–400)
RBC: 5.04 MIL/uL (ref 4.22–5.81)
RDW: 13 % (ref 11.5–15.5)
WBC: 9 10*3/uL (ref 4.0–10.5)
nRBC: 0 % (ref 0.0–0.2)

## 2022-05-15 LAB — TROPONIN I (HIGH SENSITIVITY)
Troponin I (High Sensitivity): 5 ng/L (ref <18)
Troponin I (High Sensitivity): 5 ng/L (ref <18)

## 2022-05-15 NOTE — ED Notes (Signed)
Pt left due to wife calling PCP and made an appointment @ 1pm, pt was in too much pain.

## 2022-05-15 NOTE — ED Triage Notes (Signed)
Pt reports 1 week of left sided chest pain and shortness of breath, worsening over the night. Pt also reports cough and congestion, denies fever or chills. Resp e.u at this time.

## 2022-05-21 DIAGNOSIS — L299 Pruritus, unspecified: Secondary | ICD-10-CM | POA: Diagnosis not present

## 2022-05-21 DIAGNOSIS — R052 Subacute cough: Secondary | ICD-10-CM | POA: Diagnosis not present

## 2022-05-21 DIAGNOSIS — J3089 Other allergic rhinitis: Secondary | ICD-10-CM | POA: Diagnosis not present

## 2022-05-21 DIAGNOSIS — J479 Bronchiectasis, uncomplicated: Secondary | ICD-10-CM | POA: Diagnosis not present

## 2022-06-24 DIAGNOSIS — D229 Melanocytic nevi, unspecified: Secondary | ICD-10-CM | POA: Diagnosis not present

## 2022-06-24 DIAGNOSIS — L821 Other seborrheic keratosis: Secondary | ICD-10-CM | POA: Diagnosis not present

## 2022-06-24 DIAGNOSIS — R202 Paresthesia of skin: Secondary | ICD-10-CM | POA: Diagnosis not present

## 2022-06-24 DIAGNOSIS — L570 Actinic keratosis: Secondary | ICD-10-CM | POA: Diagnosis not present

## 2022-06-24 DIAGNOSIS — Z85828 Personal history of other malignant neoplasm of skin: Secondary | ICD-10-CM | POA: Diagnosis not present

## 2022-06-24 DIAGNOSIS — D1801 Hemangioma of skin and subcutaneous tissue: Secondary | ICD-10-CM | POA: Diagnosis not present

## 2022-06-24 DIAGNOSIS — L820 Inflamed seborrheic keratosis: Secondary | ICD-10-CM | POA: Diagnosis not present

## 2022-07-21 DIAGNOSIS — Z23 Encounter for immunization: Secondary | ICD-10-CM | POA: Diagnosis not present

## 2022-08-13 DIAGNOSIS — I959 Hypotension, unspecified: Secondary | ICD-10-CM | POA: Diagnosis not present

## 2022-08-13 DIAGNOSIS — R42 Dizziness and giddiness: Secondary | ICD-10-CM | POA: Diagnosis not present

## 2022-08-18 ENCOUNTER — Encounter (HOSPITAL_BASED_OUTPATIENT_CLINIC_OR_DEPARTMENT_OTHER): Payer: Self-pay

## 2022-08-18 ENCOUNTER — Encounter (HOSPITAL_COMMUNITY): Payer: Self-pay

## 2022-08-18 ENCOUNTER — Other Ambulatory Visit: Payer: Self-pay

## 2022-08-18 ENCOUNTER — Emergency Department (HOSPITAL_BASED_OUTPATIENT_CLINIC_OR_DEPARTMENT_OTHER): Payer: Medicare Other | Admitting: Radiology

## 2022-08-18 ENCOUNTER — Inpatient Hospital Stay (HOSPITAL_BASED_OUTPATIENT_CLINIC_OR_DEPARTMENT_OTHER)
Admission: EM | Admit: 2022-08-18 | Discharge: 2022-08-22 | DRG: 378 | Disposition: A | Discharge status: Home / Self Care | Payer: Medicare Other | Attending: Internal Medicine | Admitting: Internal Medicine

## 2022-08-18 DIAGNOSIS — I951 Orthostatic hypotension: Secondary | ICD-10-CM | POA: Diagnosis present

## 2022-08-18 DIAGNOSIS — D509 Iron deficiency anemia, unspecified: Secondary | ICD-10-CM | POA: Diagnosis present

## 2022-08-18 DIAGNOSIS — K449 Diaphragmatic hernia without obstruction or gangrene: Secondary | ICD-10-CM | POA: Diagnosis present

## 2022-08-18 DIAGNOSIS — E7800 Pure hypercholesterolemia, unspecified: Secondary | ICD-10-CM | POA: Diagnosis present

## 2022-08-18 DIAGNOSIS — R42 Dizziness and giddiness: Secondary | ICD-10-CM | POA: Diagnosis not present

## 2022-08-18 DIAGNOSIS — R197 Diarrhea, unspecified: Secondary | ICD-10-CM | POA: Diagnosis present

## 2022-08-18 DIAGNOSIS — K254 Chronic or unspecified gastric ulcer with hemorrhage: Secondary | ICD-10-CM | POA: Diagnosis not present

## 2022-08-18 DIAGNOSIS — J479 Bronchiectasis, uncomplicated: Secondary | ICD-10-CM | POA: Diagnosis present

## 2022-08-18 DIAGNOSIS — K921 Melena: Secondary | ICD-10-CM | POA: Diagnosis not present

## 2022-08-18 DIAGNOSIS — Z1152 Encounter for screening for COVID-19: Secondary | ICD-10-CM

## 2022-08-18 DIAGNOSIS — K2900 Acute gastritis without bleeding: Secondary | ICD-10-CM | POA: Diagnosis not present

## 2022-08-18 DIAGNOSIS — R531 Weakness: Secondary | ICD-10-CM | POA: Diagnosis not present

## 2022-08-18 DIAGNOSIS — K2970 Gastritis, unspecified, without bleeding: Secondary | ICD-10-CM | POA: Diagnosis not present

## 2022-08-18 DIAGNOSIS — K259 Gastric ulcer, unspecified as acute or chronic, without hemorrhage or perforation: Secondary | ICD-10-CM | POA: Diagnosis not present

## 2022-08-18 DIAGNOSIS — Z79899 Other long term (current) drug therapy: Secondary | ICD-10-CM | POA: Diagnosis not present

## 2022-08-18 DIAGNOSIS — Z8249 Family history of ischemic heart disease and other diseases of the circulatory system: Secondary | ICD-10-CM

## 2022-08-18 DIAGNOSIS — K922 Gastrointestinal hemorrhage, unspecified: Secondary | ICD-10-CM

## 2022-08-18 DIAGNOSIS — Z792 Long term (current) use of antibiotics: Secondary | ICD-10-CM

## 2022-08-18 DIAGNOSIS — R0602 Shortness of breath: Secondary | ICD-10-CM | POA: Diagnosis not present

## 2022-08-18 DIAGNOSIS — R0609 Other forms of dyspnea: Secondary | ICD-10-CM | POA: Diagnosis present

## 2022-08-18 DIAGNOSIS — D62 Acute posthemorrhagic anemia: Secondary | ICD-10-CM | POA: Diagnosis present

## 2022-08-18 DIAGNOSIS — K2971 Gastritis, unspecified, with bleeding: Secondary | ICD-10-CM | POA: Diagnosis not present

## 2022-08-18 DIAGNOSIS — M1990 Unspecified osteoarthritis, unspecified site: Secondary | ICD-10-CM | POA: Diagnosis not present

## 2022-08-18 DIAGNOSIS — E860 Dehydration: Secondary | ICD-10-CM | POA: Diagnosis not present

## 2022-08-18 DIAGNOSIS — K3189 Other diseases of stomach and duodenum: Secondary | ICD-10-CM | POA: Diagnosis not present

## 2022-08-18 DIAGNOSIS — R7401 Elevation of levels of liver transaminase levels: Secondary | ICD-10-CM | POA: Diagnosis not present

## 2022-08-18 DIAGNOSIS — Z85828 Personal history of other malignant neoplasm of skin: Secondary | ICD-10-CM

## 2022-08-18 DIAGNOSIS — K2901 Acute gastritis with bleeding: Secondary | ICD-10-CM | POA: Diagnosis not present

## 2022-08-18 LAB — COMPREHENSIVE METABOLIC PANEL
ALT: 80 U/L — ABNORMAL HIGH (ref 0–44)
AST: 48 U/L — ABNORMAL HIGH (ref 15–41)
Albumin: 4.2 g/dL (ref 3.5–5.0)
Alkaline Phosphatase: 87 U/L (ref 38–126)
Anion gap: 8 (ref 5–15)
BUN: 16 mg/dL (ref 8–23)
CO2: 27 mmol/L (ref 22–32)
Calcium: 9.4 mg/dL (ref 8.9–10.3)
Chloride: 106 mmol/L (ref 98–111)
Creatinine, Ser: 1.22 mg/dL (ref 0.61–1.24)
GFR, Estimated: >60 mL/min (ref >60)
Glucose, Bld: 154 mg/dL — ABNORMAL HIGH (ref 70–99)
Potassium: 4 mmol/L (ref 3.5–5.1)
Sodium: 141 mmol/L (ref 135–145)
Total Bilirubin: 0.4 mg/dL (ref 0.3–1.2)
Total Protein: 6.1 g/dL — ABNORMAL LOW (ref 6.5–8.1)

## 2022-08-18 LAB — CBC WITH DIFFERENTIAL/PLATELET
Abs Immature Granulocytes: 0.06 10*3/uL (ref 0.00–0.07)
Basophils Absolute: 0.1 10*3/uL (ref 0.0–0.1)
Basophils Relative: 1 %
Eosinophils Absolute: 0.2 10*3/uL (ref 0.0–0.5)
Eosinophils Relative: 2 %
HCT: 30.5 % — ABNORMAL LOW (ref 39.0–52.0)
Hemoglobin: 10.1 g/dL — ABNORMAL LOW (ref 13.0–17.0)
Immature Granulocytes: 1 %
Lymphocytes Relative: 25 %
Lymphs Abs: 2.5 10*3/uL (ref 0.7–4.0)
MCH: 30.2 pg (ref 26.0–34.0)
MCHC: 33.1 g/dL (ref 30.0–36.0)
MCV: 91.3 fL (ref 80.0–100.0)
Monocytes Absolute: 0.6 10*3/uL (ref 0.1–1.0)
Monocytes Relative: 6 %
Neutro Abs: 6.4 10*3/uL (ref 1.7–7.7)
Neutrophils Relative %: 65 %
Platelets: 254 10*3/uL (ref 150–400)
RBC: 3.34 MIL/uL — ABNORMAL LOW (ref 4.22–5.81)
RDW: 14 % (ref 11.5–15.5)
WBC: 9.7 10*3/uL (ref 4.0–10.5)
nRBC: 0 % (ref 0.0–0.2)

## 2022-08-18 LAB — OCCULT BLOOD X 1 CARD TO LAB, STOOL: Fecal Occult Bld: POSITIVE — AB

## 2022-08-18 MED ORDER — OXYCODONE HCL 5 MG PO TABS: 5.0000 mg | ORAL_TABLET | ORAL | Status: DC | PRN

## 2022-08-18 MED ORDER — SODIUM CHLORIDE 0.9 % IV SOLN: INTRAVENOUS | Status: AC

## 2022-08-18 MED ORDER — ACETAMINOPHEN 325 MG PO TABS: 650.0000 mg | ORAL_TABLET | Freq: Four times a day (QID) | ORAL | Status: DC | PRN

## 2022-08-18 MED ORDER — ONDANSETRON HCL 4 MG/2ML IJ SOLN: 4.0000 mg | Freq: Four times a day (QID) | INTRAMUSCULAR | Status: DC | PRN

## 2022-08-18 MED ORDER — PANTOPRAZOLE SODIUM 40 MG IV SOLR
40.0000 mg | Freq: Once | INTRAVENOUS | Status: AC
  Administered 2022-08-18: 40 mg via INTRAVENOUS
  Filled 2022-08-18: qty 10

## 2022-08-18 MED ORDER — ACETAMINOPHEN 650 MG RE SUPP: 650.0000 mg | Freq: Four times a day (QID) | RECTAL | Status: DC | PRN

## 2022-08-18 MED ORDER — ONDANSETRON HCL 4 MG PO TABS: 4.0000 mg | ORAL_TABLET | Freq: Four times a day (QID) | ORAL | Status: DC | PRN

## 2022-08-18 MED ORDER — SODIUM CHLORIDE 0.9% FLUSH
3.0000 mL | Freq: Two times a day (BID) | INTRAVENOUS | Status: DC
  Administered 2022-08-19 – 2022-08-21 (×7): 3 mL via INTRAVENOUS

## 2022-08-18 MED ORDER — AZITHROMYCIN 250 MG PO TABS: 250.0000 mg | ORAL_TABLET | Freq: Every day | ORAL | Status: DC

## 2022-08-18 MED ORDER — HYDROXYZINE HCL 25 MG PO TABS
25.0000 mg | ORAL_TABLET | Freq: Three times a day (TID) | ORAL | Status: DC | PRN
  Administered 2022-08-19 – 2022-08-21 (×4): 25 mg via ORAL
  Filled 2022-08-18 (×4): qty 1

## 2022-08-18 MED ORDER — PANTOPRAZOLE SODIUM 40 MG IV SOLR
40.0000 mg | Freq: Two times a day (BID) | INTRAVENOUS | Status: AC
  Administered 2022-08-19 – 2022-08-21 (×5): 40 mg via INTRAVENOUS
  Filled 2022-08-18 (×5): qty 10

## 2022-08-18 NOTE — H&P (Addendum)
History and Physical    Eric [name] WNI:[id number] DOB: [birth year] DOA: 08/18/2022  PCP: Corliss Blacker, MD   Patient coming from: Home   Chief Complaint: DOE, lightheaded, general weakness   HPI: Eric [name] is a pleasant 76 y.o. male with medical history significant for hyperlipidemia and bronchiectasis who presents to the emergency department with exertional dyspnea, lightheadedness, and general weakness.  He began experiencing these symptoms roughly a month ago and reports having extensive outpatient work-up including echocardiogram that was unrevealing.  Symptoms worsened significantly on 08/13/2022.  He does not look at his stool and is unable to comment on presence of melena or hematochezia.  He denies abdominal pain, nausea, or vomiting.  He uses Aleve 2 or 3 times weekly but no other NSAID or anticoagulant.  He denies any significant alcohol use.  [facility] ED Course: Upon arrival to the ED, patient is found to be afebrile and saturating well on room air with stable blood pressure and normal heart rate.  He is in sinus rhythm on EKG and chest x-ray is negative for acute cardiopulmonary disease.  Blood work notable for AST 48, ALT 80, and hemoglobin 10.1 (15.4 in August 2023).  ED MD reports melena on DRE.  Fecal occult blood testing is positive.   [facility] was consulted by the ED physician, 40 mg IV Protonix was administered, and the patient was transferred to [facility] for admission.  Review of Systems:  All other systems reviewed and apart from HPI, are negative.  Past Medical History:  Diagnosis Date   Chest pain    Hypercholesterolemia    Nodular basal cell carcinoma (BCC) 06/24/2017   Right Upper Back (curet, cautery and 5FU)   Nodular basal cell carcinoma (BCC) 09/30/2019   Upper Mid Chest (treatment after biopsy)   Osteoarthritis    Pneumonia    right middle lobe necrotizing pneumonia with abcess as well ass effusion and empyena and  decortication    Past Surgical History:  Procedure Laterality Date   CARDIAC CATHETERIZATION     2005   CARPOMETACARPEL SUSPENSION PLASTY Left 02/25/2018   Procedure: LEFT THUMB TRAPEZIECTOMY WITH SUSPENSIONPLASTY;  Surgeon: Leanora Cover, MD;  Location: [place];  Service: Orthopedics;  Laterality: Left;   LUMBAR DISC SURGERY      Social History:   reports that he has never smoked. He has never used smokeless tobacco. He reports current alcohol use. He reports that he does not use drugs.  No Known Allergies  Family History  Problem Relation Age of Onset   Heart disease Father      Prior to Admission medications   Medication Sig Start Date End Date Taking? Authorizing Provider  atorvastatin (LIPITOR) 40 MG tablet  11/11/19   [provider]  azithromycin (ZITHROMAX) 250 MG tablet  05/02/20   [provider]    Physical Exam: Vitals:   08/18/22 1730 08/18/22 1811 08/18/22 2127 08/18/22 2228  BP: 136/74 (!) 145/79 110/67 117/69  Pulse: 67 69 81 76  Resp: '13 14 17 18  '$ Temp:  98.4 F (36.9 C)  98.6 F (37 C)  TempSrc:  Oral  Oral  SpO2: 97% 99% 98% 98%  Weight:    83.9 kg  Height:    6' 1.5" (1.867 m)    Constitutional: NAD, calm  Eyes: PERTLA, lids and conjunctivae normal ENMT: Mucous membranes are moist. Posterior pharynx clear of any exudate or lesions.   Neck: supple, no masses  Respiratory:  no wheezing, no crackles. No accessory muscle use.  Cardiovascular: S1 & S2 heard, regular rate and rhythm. No extremity edema.   Abdomen: No distension, no tenderness, soft. Bowel sounds active.  Musculoskeletal: no clubbing / cyanosis. No joint deformity upper and lower extremities.   Skin: no significant rashes, lesions, ulcers. Warm, dry, well-perfused. Neurologic: CN 2-12 grossly intact. Moving all extremities. Alert and oriented.  Psychiatric: Pleasant. Cooperative.    Labs and Imaging on Admission: I have personally reviewed following  labs and imaging studies  CBC: Recent Labs  Lab 08/18/22 1425  WBC 9.7  NEUTROABS 6.4  HGB 10.1*  HCT 30.5*  MCV 91.3  PLT 536   Basic Metabolic Panel: Recent Labs  Lab 08/18/22 1425  NA 141  K 4.0  CL 106  CO2 27  GLUCOSE 154*  BUN 16  CREATININE 1.22  CALCIUM 9.4   GFR: Estimated Creatinine Clearance: 60.9 mL/min (by C-G formula based on SCr of 1.22 mg/dL). Liver Function Tests: Recent Labs  Lab 08/18/22 1425  AST 48*  ALT 80*  ALKPHOS 87  BILITOT 0.4  PROT 6.1*  ALBUMIN 4.2   No results for input(s): "LIPASE", "AMYLASE" in the last 168 hours. No results for input(s): "AMMONIA" in the last 168 hours. Coagulation Profile: No results for input(s): "INR", "PROTIME" in the last 168 hours. Cardiac Enzymes: No results for input(s): "CKTOTAL", "CKMB", "CKMBINDEX", "TROPONINI" in the last 168 hours. BNP (last 3 results) No results for input(s): "PROBNP" in the last 8760 hours. HbA1C: No results for input(s): "HGBA1C" in the last 72 hours. CBG: No results for input(s): "GLUCAP" in the last 168 hours. Lipid Profile: No results for input(s): "CHOL", "HDL", "LDLCALC", "TRIG", "CHOLHDL", "LDLDIRECT" in the last 72 hours. Thyroid Function Tests: No results for input(s): "TSH", "T4TOTAL", "FREET4", "T3FREE", "THYROIDAB" in the last 72 hours. Anemia Panel: No results for input(s): "VITAMINB12", "FOLATE", "FERRITIN", "TIBC", "IRON", "RETICCTPCT" in the last 72 hours. Urine analysis: No results found for: "COLORURINE", "APPEARANCEUR", "LABSPEC", "PHURINE", "GLUCOSEU", "HGBUR", "BILIRUBINUR", "KETONESUR", "PROTEINUR", "UROBILINOGEN", "NITRITE", "LEUKOCYTESUR" Sepsis Labs: '@LABRCNTIP'$ (procalcitonin:4,lacticidven:4) )No results found for this or any previous visit (from the past 240 hour(s)).   Radiological Exams on Admission: DG Chest 2 View  Result Date: 08/18/2022 CLINICAL DATA:  Shortness of breath and dizziness for 1 month. EXAM: CHEST - 2 VIEW COMPARISON:   05/15/2022 FINDINGS: Midline trachea. Normal heart size. Tortuous thoracic aorta. No pleural effusion or pneumothorax. Mild left base scarring. IMPRESSION: No active cardiopulmonary disease. Electronically Signed   By: Abigail Miyamoto M.D.   On: 08/18/2022 14:44    EKG: Independently reviewed. Sinus rhythm.   Assessment/Plan   1. GI bleeding; anemia  - BUN is normal and there are no upper GI symptoms but ED physician reports seeing melena on DRE  - He is hemodynamically stable and initial Hgb is 10.1 (15.4 in August)  - Appreciate GI consultation  - Continue IV PPI, trend blood counts, transfuse if needed, keep NPO pending GI evaluation    2. Elevated transaminases  - AST 48 and ALT 80 on admission (normal in 2018)  - Check viral hepatitis panel and RUQ [place], hold statin and azithromycin for now     3. Bronchiectasis  - Hold azithromycin while working up elevated LFTs    DVT prophylaxis: SCDs  Code Status: Full  Level of Care: Level of care: Progressive Family Communication: Wife at bedside   Disposition Plan:  Patient is from: home  Anticipated d/c is to: Home Anticipated d/c date is:  08/20/22  Patient currently: Pending GI consultation, stable H&H  Consults called: [facility]  Admission status: Inpatient     Vianne Bulls, MD [facility]  08/18/2022, 11:03 PM

## 2022-08-18 NOTE — ED Provider Notes (Signed)
[facility] EMERGENCY DEPT Provider Note   CSN: [id number] Arrival date & time: 08/18/22  1357     History Chief Complaint  Patient presents with   Shortness of Breath    HPI Massie Mees Beale is a 76 y.o. male presenting for fatigue, weakness presenting over the past 3 weeks with progression over the last 3 days.  He has a baseline a very active gentleman, performs all of his own ADLs and many of his own IADLs.  He noted that he started having diarrhea over this time.  With loose in stools.  He states he does not look at the stools. He has been going to the bathroom 4-5 times per day which is increased from his baseline.  He denies any fevers chills, nausea vomiting compassing shortness of breath.  He is otherwise ambulatory tolerating p.o. intake.  He endorses severe dyspnea on exertion, he has been struggling to get up and get around the house lately..   Patient's recorded medical, surgical, social, medication list and allergies were reviewed in the Snapshot window as part of the initial history.   Review of Systems   Review of Systems  Constitutional:  Negative for chills and fever.  HENT:  Negative for ear pain and sore throat.   Eyes:  Negative for pain and visual disturbance.  Respiratory:  Negative for cough and shortness of breath.   Cardiovascular:  Negative for chest pain and palpitations.  Gastrointestinal:  Positive for blood in stool and diarrhea. Negative for abdominal pain and vomiting.  Genitourinary:  Negative for dysuria and hematuria.  Musculoskeletal:  Negative for arthralgias and back pain.  Skin:  Negative for color change and rash.  Neurological:  Negative for seizures and syncope.  All other systems reviewed and are negative.   Physical Exam Updated Vital Signs BP 136/71 (BP Location: Right Arm)   Pulse 81   Temp 98 F (36.7 C) (Oral)   Resp 18   Ht '6\' 1"'$  (1.854 m)   Wt 79.4 kg   SpO2 100%   BMI 23.09 kg/m  Physical Exam Vitals  and nursing note reviewed. Exam conducted with a chaperone present.  Constitutional:      General: He is not in acute distress.    Appearance: He is well-developed.  HENT:     Head: Normocephalic and atraumatic.  Eyes:     Conjunctiva/sclera: Conjunctivae normal.  Cardiovascular:     Rate and Rhythm: Normal rate and regular rhythm.     Heart sounds: No murmur heard. Pulmonary:     Effort: Pulmonary effort is normal. No respiratory distress.     Breath sounds: Normal breath sounds.  Abdominal:     Palpations: Abdomen is soft.     Tenderness: There is no abdominal tenderness.  Genitourinary:    Rectum: Guaiac result positive.     Comments: Current melena on exam at this time. Musculoskeletal:        General: No swelling.     Cervical back: Neck supple.  Skin:    General: Skin is warm and dry.     Capillary Refill: Capillary refill takes less than 2 seconds.  Neurological:     Mental Status: He is alert.  Psychiatric:        Mood and Affect: Mood normal.      ED Course/ Medical Decision Making/ A&P Clinical Course as of 08/18/22 2307  [facility] Aug 18, 2022  [place] Rectal and reassess [CC]  1842 NPO and  protonix KARKI [CC]  2011 [facility] [CC]    Clinical Course User Index [CC] Tretha Sciara, MD    Procedures Procedures   Medications Ordered in ED Medications - No data to display  Medical Decision Making:    Archie Shea Cloe is a 76 y.o. male who presented to the ED today with fatigue and SOB detailed above.     Patient's presentation is complicated by their history of advanced age.  Patient placed on continuous vitals and telemetry monitoring while in ED which was reviewed periodically.   Complete initial physical exam performed, notably the patient  was hemodynamically stable in no acute distress.      Reviewed and confirmed nursing documentation for past medical history, family history, social history.    Initial Assessment:   With the patient's  presentation of fatigue and frequent bowel movements, most likely diagnosis is developing upper GI bleed given his intermittent epigastric discomfort. Other diagnoses were considered including (but not limited to) lower GI bleed including diverticular bleed, also considered pneumonia, ACS, PE. These are considered less likely due to history of present illness and physical exam findings.   This is most consistent with an acute life/limb threatening illness complicated by underlying chronic conditions.  Initial Plan:  Screening labs including CBC and Metabolic panel to evaluate for infectious or metabolic etiology of disease.  Urinalysis with reflex culture ordered to evaluate for UTI or relevant urologic/nephrologic pathology.  CXR to evaluate for structural/infectious intrathoracic pathology.  EKG to evaluate for cardiac pathology. Objective evaluation as below reviewed with plan for close reassessment  Initial Study Results:   Laboratory  All laboratory results reviewed without evidence of clinically relevant pathology.   Exceptions include: 5 point HGB drop over 3 months   EKG EKG was reviewed independently. Rate, rhythm, axis, intervals all examined and without medically relevant abnormality. ST segments without concerns for elevations.    Radiology  All images reviewed independently. Agree with radiology report at this time.   DG Chest 2 View  Result Date: 08/18/2022 CLINICAL DATA:  Shortness of breath and dizziness for 1 month. EXAM: CHEST - 2 VIEW COMPARISON:  05/15/2022 FINDINGS: Midline trachea. Normal heart size. Tortuous thoracic aorta. No pleural effusion or pneumothorax. Mild left base scarring. IMPRESSION: No active cardiopulmonary disease. Electronically Signed   By: Abigail Miyamoto M.D.   On: 08/18/2022 14:44     Consults:  Case discussed with Dr. Therisa Doyne with [facility] since patient follows with Dr. Dory Peru.   Final Assessment and Plan:   Dr. Deno Etienne recommended n.p.o. at  midnight, initiation of IV Protonix, transfer to [facility] for evaluation by [facility] gastroenterology.   Disposition:   Based on the above findings, I believe this patient is stable for admission.    Patient/family educated about specific findings on our evaluation and explained exact reasons for admission.  Patient/family educated about clinical situation and time was allowed to answer questions.   Admission team communicated with and agreed with need for admission. Patient admitted. Patient  ready to move at this time.     Emergency Department Medication Summary:   Medications  pantoprazole (PROTONIX) injection 40 mg (has no administration in time range)         Clinical Impression: No diagnosis found.   Data Unavailable   Final Clinical Impression(s) / ED Diagnoses Final diagnoses:  None    Rx / DC Orders ED Discharge Orders     None         Geneive Sandstrom,  MD 08/18/22 2307

## 2022-08-18 NOTE — ED Triage Notes (Addendum)
Pt reports SHOB and dizziness x 1 month  Worse today Saw PCP last week, had an echo done and was dehydrated hgb at that visit was 10.6

## 2022-08-19 ENCOUNTER — Inpatient Hospital Stay (HOSPITAL_COMMUNITY): Payer: Medicare Other

## 2022-08-19 DIAGNOSIS — K922 Gastrointestinal hemorrhage, unspecified: Secondary | ICD-10-CM | POA: Diagnosis not present

## 2022-08-19 LAB — CBC
HCT: 28 % — ABNORMAL LOW (ref 39.0–52.0)
HCT: 28.7 % — ABNORMAL LOW (ref 39.0–52.0)
HCT: 29.3 % — ABNORMAL LOW (ref 39.0–52.0)
Hemoglobin: 9.5 g/dL — ABNORMAL LOW (ref 13.0–17.0)
Hemoglobin: 9.5 g/dL — ABNORMAL LOW (ref 13.0–17.0)
Hemoglobin: 9.7 g/dL — ABNORMAL LOW (ref 13.0–17.0)
MCH: 30.7 pg (ref 26.0–34.0)
MCH: 31 pg (ref 26.0–34.0)
MCH: 31.4 pg (ref 26.0–34.0)
MCHC: 33.1 g/dL (ref 30.0–36.0)
MCHC: 33.1 g/dL (ref 30.0–36.0)
MCHC: 33.9 g/dL (ref 30.0–36.0)
MCV: 92.4 fL (ref 80.0–100.0)
MCV: 92.7 fL (ref 80.0–100.0)
MCV: 93.8 fL (ref 80.0–100.0)
Platelets: 233 10*3/uL (ref 150–400)
Platelets: 238 10*3/uL (ref 150–400)
Platelets: 240 10*3/uL (ref 150–400)
RBC: 3.03 MIL/uL — ABNORMAL LOW (ref 4.22–5.81)
RBC: 3.06 MIL/uL — ABNORMAL LOW (ref 4.22–5.81)
RBC: 3.16 MIL/uL — ABNORMAL LOW (ref 4.22–5.81)
RDW: 13.9 % (ref 11.5–15.5)
RDW: 14.1 % (ref 11.5–15.5)
RDW: 14.1 % (ref 11.5–15.5)
WBC: 8.7 10*3/uL (ref 4.0–10.5)
WBC: 9.1 10*3/uL (ref 4.0–10.5)
WBC: 9.2 10*3/uL (ref 4.0–10.5)
nRBC: 0 % (ref 0.0–0.2)
nRBC: 0 % (ref 0.0–0.2)
nRBC: 0 % (ref 0.0–0.2)

## 2022-08-19 LAB — COMPREHENSIVE METABOLIC PANEL
ALT: 71 U/L — ABNORMAL HIGH (ref 0–44)
AST: 44 U/L — ABNORMAL HIGH (ref 15–41)
Albumin: 3.2 g/dL — ABNORMAL LOW (ref 3.5–5.0)
Alkaline Phosphatase: 78 U/L (ref 38–126)
Anion gap: 6 (ref 5–15)
BUN: 17 mg/dL (ref 8–23)
CO2: 26 mmol/L (ref 22–32)
Calcium: 8.4 mg/dL — ABNORMAL LOW (ref 8.9–10.3)
Chloride: 108 mmol/L (ref 98–111)
Creatinine, Ser: 1.25 mg/dL — ABNORMAL HIGH (ref 0.61–1.24)
GFR, Estimated: >60 mL/min (ref >60)
Glucose, Bld: 165 mg/dL — ABNORMAL HIGH (ref 70–99)
Potassium: 3.6 mmol/L (ref 3.5–5.1)
Sodium: 140 mmol/L (ref 135–145)
Total Bilirubin: 0.5 mg/dL (ref 0.3–1.2)
Total Protein: 5.4 g/dL — ABNORMAL LOW (ref 6.5–8.1)

## 2022-08-19 LAB — TSH: TSH: 4.867 u[IU]/mL — ABNORMAL HIGH (ref 0.350–4.500)

## 2022-08-19 LAB — HEPATITIS PANEL, ACUTE
HCV Ab: NONREACTIVE
Hep A IgM: NONREACTIVE
Hep B C IgM: NONREACTIVE
Hepatitis B Surface Ag: NONREACTIVE

## 2022-08-19 LAB — MAGNESIUM: Magnesium: 1.9 mg/dL (ref 1.7–2.4)

## 2022-08-19 LAB — TYPE AND SCREEN
ABO/RH(D): A POS
Antibody Screen: NEGATIVE

## 2022-08-19 LAB — HEMOGLOBIN A1C
Hgb A1c MFr Bld: 5.2 % (ref 4.8–5.6)
Mean Plasma Glucose: 102.54 mg/dL

## 2022-08-19 LAB — IRON AND TIBC
Iron: 22 ug/dL — ABNORMAL LOW (ref 45–182)
Saturation Ratios: 8 % — ABNORMAL LOW (ref 17.9–39.5)
TIBC: 273 ug/dL (ref 250–450)
UIBC: 251 ug/dL

## 2022-08-19 LAB — FOLATE: Folate: 25.5 ng/mL (ref >5.9)

## 2022-08-19 LAB — VITAMIN B12: Vitamin B-12: 648 pg/mL (ref 180–914)

## 2022-08-19 MED ORDER — ATORVASTATIN CALCIUM 40 MG PO TABS
40.0000 mg | ORAL_TABLET | Freq: Every day | ORAL | Status: DC
  Administered 2022-08-19 – 2022-08-22 (×3): 40 mg via ORAL
  Filled 2022-08-19 (×3): qty 1

## 2022-08-19 NOTE — TOC Progression Note (Signed)
Transition of Care [facility]) - Progression Note    Patient Details  Name: [name] MRN: [id number] Date of Birth: [birth year]  Transition of Care [facility]) CM/SW Contact  Leeroy Cha, RN Phone Number: 08/19/2022, 8:25 AM  Clinical Narrative:     Transition of Care (TOC) Screening Note   Patient Details  Name: [name] Date of Birth: [birth year]   [facility] CM/SW Contact:    Leeroy Cha, RN Phone Number: 08/19/2022, 8:25 AM    Transition of Care Department [facility]) has reviewed patient and no TOC needs have been identified at this time. We will continue to monitor patient advancement through interdisciplinary progression rounds. If new patient transition needs arise, please place a TOC consult.     Expected Discharge Plan: Home/Self Care Barriers to Discharge: Continued Medical Work up  Expected Discharge Plan and Services Expected Discharge Plan: Home/Self Care   Discharge Planning Services: CM Consult   Living arrangements for the past 2 months: Single Family Home                                       Social Determinants of Health (SDOH) Interventions    Readmission Risk Interventions   No data to display

## 2022-08-19 NOTE — Consult Note (Signed)
Referring Provider: Dr. Oswald Hillock Primary Care Physician:  Corliss Blacker, MD Primary Gastroenterologist:  Dr. Alessandra Bevels  Reason for Consultation:  GI bleed  HPI: Eric [name] is a 76 y.o. male with weakness and dizziness for the past month that has worsened this week. Hgb 10.1 (15.4 in August). Does not look at his stool so unknown whether he had black or red stools but has noticed a change to loose stools for the past 2 months but does not have a stool everyday. Denies abdominal pain/N/V. Melena on rectal by ER doc. Occasional NSAIDs. EGD 12/2020 showed Candida esophagitis, mild gastritis and a small hiatal hernia. Normal colonoscopy in 07/2018.  Past Medical History:  Diagnosis Date   Chest pain    Hypercholesterolemia    Nodular basal cell carcinoma (BCC) 06/24/2017   Right Upper Back (curet, cautery and 5FU)   Nodular basal cell carcinoma (BCC) 09/30/2019   Upper Mid Chest (treatment after biopsy)   Osteoarthritis    Pneumonia    right middle lobe necrotizing pneumonia with abcess as well ass effusion and empyena and decortication    Past Surgical History:  Procedure Laterality Date   CARDIAC CATHETERIZATION     2005   CARPOMETACARPEL SUSPENSION PLASTY Left 02/25/2018   Procedure: LEFT THUMB TRAPEZIECTOMY WITH SUSPENSIONPLASTY;  Surgeon: Leanora Cover, MD;  Location: [place];  Service: Orthopedics;  Laterality: Left;   LUMBAR DISC SURGERY      Prior to Admission medications   Medication Sig Start Date End Date Taking? Authorizing Provider  atorvastatin (LIPITOR) 40 MG tablet Take 40 mg by mouth daily. 11/11/19  Yes [provider]  azithromycin (ZITHROMAX) 250 MG tablet Take 250 mg by mouth daily. 05/02/20  Yes [provider]  hydrOXYzine (ATARAX) 25 MG tablet Take 25 mg by mouth in the morning and at bedtime.   Yes [provider]  OVER THE COUNTER MEDICATION Take 1 capsule by mouth daily. Vitamin B po   Yes [provider]  OVER THE COUNTER MEDICATION Take 1 tablet by mouth at bedtime. Condroitin PO   Yes [provider]    Scheduled Meds:  atorvastatin  40 mg Oral Daily   pantoprazole (PROTONIX) IV  40 mg Intravenous Q12H   sodium chloride flush  3 mL Intravenous Q12H   Continuous Infusions:  sodium chloride 75 mL/hr at 08/19/22 0903   PRN Meds:.acetaminophen **OR** acetaminophen, hydrOXYzine, ondansetron **OR** ondansetron (ZOFRAN) IV, oxyCODONE  Allergies as of 08/18/2022   (No Known Allergies)    Family History  Problem Relation Age of Onset   Heart disease Father     Social History   Socioeconomic History   Marital status: Married    Spouse name: Not on file   Number of children: Not on file   Years of education: Not on file   Highest education level: Not on file  Occupational History   Not on file  Tobacco Use   Smoking status: Never   Smokeless tobacco: Never  Vaping Use   Vaping Use: Never used  Substance and Sexual Activity   Alcohol use: Yes   Drug use: No   Sexual activity: Not on file  Other Topics Concern   Not on file  Social History Narrative   Not on file   Social Determinants of Health   Financial Resource Strain: Not on file  Food Insecurity: No Food Insecurity (08/18/2022)   Hunger Vital Sign    Worried About Charity fundraiser in  the Last Year: Never true    Ran Out of Food in the Last Year: Never true  Transportation Needs: Not on file  Physical Activity: Not on file  Stress: Not on file  Social Connections: Not on file  Intimate Partner Violence: Not on file    Review of Systems: All negative except as stated above in HPI.  Physical Exam: Vital signs: Vitals:   08/19/22 0240 08/19/22 0600  BP: 103/71 115/71  Pulse: 69 73  Resp: 18 16  Temp: 99.5 F (37.5 C) 98.6 F (37 C)  SpO2: 97% 95%     General:  Lethargic, Well-developed, well-nourished, pleasant and cooperative in NAD Head: normocephalic, atraumatic Eyes:  anicteric sclera ENT: oropharynx clear Neck: supple, nontender Lungs:  Clear throughout to auscultation.   No wheezes, crackles, or rhonchi. No acute distress. Heart:  Regular rate and rhythm; no murmurs, clicks, rubs,  or gallops. Abdomen: soft, nontender, nondistended, +BS  Rectal:  Deferred Ext: no edema  GI:  Lab Results: Recent Labs    08/18/22 1425 08/19/22 0008  WBC 9.7 9.1  HGB 10.1* 9.5*  HCT 30.5* 28.0*  PLT 254 240   BMET Recent Labs    08/18/22 1425 08/19/22 0008  NA 141 140  K 4.0 3.6  CL 106 108  CO2 27 26  GLUCOSE 154* 165*  BUN 16 17  CREATININE 1.22 1.25*  CALCIUM 9.4 8.4*   LFT Recent Labs    08/19/22 0008  PROT 5.4*  ALBUMIN 3.2*  AST 44*  ALT 71*  ALKPHOS 78  BILITOT 0.5   PT/INR No results for input(s): "LABPROT", "INR" in the last 72 hours.   Studies/Results: US Abdomen Limited RUQ (LIVER/GB)  Result Date: 08/19/2022 CLINICAL DATA:  Elevated transaminase EXAM: ULTRASOUND ABDOMEN LIMITED RIGHT UPPER QUADRANT COMPARISON:  01/27/2022 FINDINGS: Gallbladder: Gallstones: None Sludge: None Gallbladder Wall: Within normal limits Pericholecystic fluid: None Sonographic Murphy's Sign: Negative per technologist Common bile duct: Diameter: 2 mm Liver: Parenchymal echogenicity: Within normal limits Contours: Normal Lesions: 2 simple cysts are seen in the right liver lobe measuring 2.1 and 1.3 cm. These do not require further imaging follow-up. Portal vein: Patent.  Hepatopetal flow Other: None. IMPRESSION: No significant sonographic abnormality of the liver or gallbladder. Electronically Signed   By: Miachel Roux M.D.   On: 08/19/2022 06:29   DG Chest 2 View  Result Date: 08/18/2022 CLINICAL DATA:  Shortness of breath and dizziness for 1 month. EXAM: CHEST - 2 VIEW COMPARISON:  05/15/2022 FINDINGS: Midline trachea. Normal heart size. Tortuous thoracic aorta. No pleural effusion or pneumothorax. Mild left base scarring. IMPRESSION: No active  cardiopulmonary disease. Electronically Signed   By: Abigail Miyamoto M.D.   On: 08/18/2022 14:44    Impression/Plan: Symptomatic anemia with melena on rectal by ER doc. Question peptic ulcer disease. Continue IV PPI Q 12 hours. Clear liquid diet. NPO p MN. EGD tomorrow to further evaluate.    LOS: 1 day   Lear Ng  08/19/2022, 9:14 AM  Questions please call [phone]

## 2022-08-19 NOTE — Hospital Course (Signed)
Eric [name] is a pleasant 76 y.o. male with medical history significant for hyperlipidemia and bronchiectasis who presents to the emergency department with exertional dyspnea, lightheadedness, and general weakness.   He began experiencing these symptoms roughly a month ago and reports having extensive outpatient work-up including echocardiogram that was unrevealing.  Symptoms worsened significantly on 08/13/2022.  He does not look at his stool and is unable to comment on presence of melena or hematochezia.  He denies abdominal pain, nausea, or vomiting.  He uses Aleve 2 or 3 times weekly but no other NSAID or anticoagulant.  He denies any significant alcohol use.   [facility] ED Course: Upon arrival to the ED, patient is found to be afebrile and saturating well on room air with stable blood pressure and normal heart rate.  He is in sinus rhythm on EKG and chest x-ray is negative for acute cardiopulmonary disease.  Blood work notable for AST 48, ALT 80, and hemoglobin 10.1 (15.4 in August 2023).  ED MD reports melena on DRE.  Fecal occult blood testing is positive.    [facility] was consulted by the ED physician, 40 mg IV Protonix was administered, and the patient was transferred to [facility] for admission.  Assessment/Plan    1. GI bleeding; anemia  - BUN is normal and there are no upper GI symptoms but ED physician reports seeing melena on DRE  - He is hemodynamically stable and initial Hgb is 10.1 (15.4 in August)  - Appreciate GI consultation  - Continue IV PPI, trend blood counts, transfuse if needed, keep NPO pending GI evaluation     2. Elevated transaminases  - AST 48 and ALT 80 on admission (normal in 2018)  - Check viral hepatitis panel and RUQ [place], hold statin and azithromycin for now      3. Bronchiectasis  - Hold azithromycin while working up elevated LFTs

## 2022-08-19 NOTE — H&P (View-Only) (Signed)
Referring Provider: Dr. Oswald Hillock Primary Care Physician:  Corliss Blacker, MD Primary Gastroenterologist:  Dr. Alessandra Bevels  Reason for Consultation:  GI bleed  HPI: Eric [name] is a 76 y.o. male with weakness and dizziness for the past month that has worsened this week. Hgb 10.1 (15.4 in August). Does not look at his stool so unknown whether he had black or red stools but has noticed a change to loose stools for the past 2 months but does not have a stool everyday. Denies abdominal pain/N/V. Melena on rectal by ER doc. Occasional NSAIDs. EGD 12/2020 showed Candida esophagitis, mild gastritis and a small hiatal hernia. Normal colonoscopy in 07/2018.  Past Medical History:  Diagnosis Date   Chest pain    Hypercholesterolemia    Nodular basal cell carcinoma (BCC) 06/24/2017   Right Upper Back (curet, cautery and 5FU)   Nodular basal cell carcinoma (BCC) 09/30/2019   Upper Mid Chest (treatment after biopsy)   Osteoarthritis    Pneumonia    right middle lobe necrotizing pneumonia with abcess as well ass effusion and empyena and decortication    Past Surgical History:  Procedure Laterality Date   CARDIAC CATHETERIZATION     2005   CARPOMETACARPEL SUSPENSION PLASTY Left 02/25/2018   Procedure: LEFT THUMB TRAPEZIECTOMY WITH SUSPENSIONPLASTY;  Surgeon: Leanora Cover, MD;  Location: [place];  Service: Orthopedics;  Laterality: Left;   LUMBAR DISC SURGERY      Prior to Admission medications   Medication Sig Start Date End Date Taking? Authorizing Provider  atorvastatin (LIPITOR) 40 MG tablet Take 40 mg by mouth daily. 11/11/19  Yes [provider]  azithromycin (ZITHROMAX) 250 MG tablet Take 250 mg by mouth daily. 05/02/20  Yes [provider]  hydrOXYzine (ATARAX) 25 MG tablet Take 25 mg by mouth in the morning and at bedtime.   Yes [provider]  OVER THE COUNTER MEDICATION Take 1 capsule by mouth daily. Vitamin B po   Yes [provider]  OVER THE COUNTER MEDICATION Take 1 tablet by mouth at bedtime. Condroitin PO   Yes [provider]    Scheduled Meds:  atorvastatin  40 mg Oral Daily   pantoprazole (PROTONIX) IV  40 mg Intravenous Q12H   sodium chloride flush  3 mL Intravenous Q12H   Continuous Infusions:  sodium chloride 75 mL/hr at 08/19/22 0903   PRN Meds:.acetaminophen **OR** acetaminophen, hydrOXYzine, ondansetron **OR** ondansetron (ZOFRAN) IV, oxyCODONE  Allergies as of 08/18/2022   (No Known Allergies)    Family History  Problem Relation Age of Onset   Heart disease Father     Social History   Socioeconomic History   Marital status: Married    Spouse name: Not on file   Number of children: Not on file   Years of education: Not on file   Highest education level: Not on file  Occupational History   Not on file  Tobacco Use   Smoking status: Never   Smokeless tobacco: Never  Vaping Use   Vaping Use: Never used  Substance and Sexual Activity   Alcohol use: Yes   Drug use: No   Sexual activity: Not on file  Other Topics Concern   Not on file  Social History Narrative   Not on file   Social Determinants of Health   Financial Resource Strain: Not on file  Food Insecurity: No Food Insecurity (08/18/2022)   Hunger Vital Sign    Worried About Charity fundraiser in  the Last Year: Never true    Ran Out of Food in the Last Year: Never true  Transportation Needs: Not on file  Physical Activity: Not on file  Stress: Not on file  Social Connections: Not on file  Intimate Partner Violence: Not on file    Review of Systems: All negative except as stated above in HPI.  Physical Exam: Vital signs: Vitals:   08/19/22 0240 08/19/22 0600  BP: 103/71 115/71  Pulse: 69 73  Resp: 18 16  Temp: 99.5 F (37.5 C) 98.6 F (37 C)  SpO2: 97% 95%     General:  Lethargic, Well-developed, well-nourished, pleasant and cooperative in NAD Head: normocephalic, atraumatic Eyes:  anicteric sclera ENT: oropharynx clear Neck: supple, nontender Lungs:  Clear throughout to auscultation.   No wheezes, crackles, or rhonchi. No acute distress. Heart:  Regular rate and rhythm; no murmurs, clicks, rubs,  or gallops. Abdomen: soft, nontender, nondistended, +BS  Rectal:  Deferred Ext: no edema  GI:  Lab Results: Recent Labs    08/18/22 1425 08/19/22 0008  WBC 9.7 9.1  HGB 10.1* 9.5*  HCT 30.5* 28.0*  PLT 254 240   BMET Recent Labs    08/18/22 1425 08/19/22 0008  NA 141 140  K 4.0 3.6  CL 106 108  CO2 27 26  GLUCOSE 154* 165*  BUN 16 17  CREATININE 1.22 1.25*  CALCIUM 9.4 8.4*   LFT Recent Labs    08/19/22 0008  PROT 5.4*  ALBUMIN 3.2*  AST 44*  ALT 71*  ALKPHOS 78  BILITOT 0.5   PT/INR No results for input(s): "LABPROT", "INR" in the last 72 hours.   Studies/Results: US Abdomen Limited RUQ (LIVER/GB)  Result Date: 08/19/2022 CLINICAL DATA:  Elevated transaminase EXAM: ULTRASOUND ABDOMEN LIMITED RIGHT UPPER QUADRANT COMPARISON:  01/27/2022 FINDINGS: Gallbladder: Gallstones: None Sludge: None Gallbladder Wall: Within normal limits Pericholecystic fluid: None Sonographic Murphy's Sign: Negative per technologist Common bile duct: Diameter: 2 mm Liver: Parenchymal echogenicity: Within normal limits Contours: Normal Lesions: 2 simple cysts are seen in the right liver lobe measuring 2.1 and 1.3 cm. These do not require further imaging follow-up. Portal vein: Patent.  Hepatopetal flow Other: None. IMPRESSION: No significant sonographic abnormality of the liver or gallbladder. Electronically Signed   By: Miachel Roux M.D.   On: 08/19/2022 06:29   DG Chest 2 View  Result Date: 08/18/2022 CLINICAL DATA:  Shortness of breath and dizziness for 1 month. EXAM: CHEST - 2 VIEW COMPARISON:  05/15/2022 FINDINGS: Midline trachea. Normal heart size. Tortuous thoracic aorta. No pleural effusion or pneumothorax. Mild left base scarring. IMPRESSION: No active  cardiopulmonary disease. Electronically Signed   By: Abigail Miyamoto M.D.   On: 08/18/2022 14:44    Impression/Plan: Symptomatic anemia with melena on rectal by ER doc. Question peptic ulcer disease. Continue IV PPI Q 12 hours. Clear liquid diet. NPO p MN. EGD tomorrow to further evaluate.    LOS: 1 day   Lear Ng  08/19/2022, 9:14 AM  Questions please call [phone]

## 2022-08-19 NOTE — Progress Notes (Signed)
PROGRESS NOTE    Patient: Eric [name]                            PCP: Corliss Blacker, MD                    DOB: January 12, 1946            DOA: 08/18/2022 [id number]             DOS: 08/19/2022, 12:41 PM   LOS: 1 day   Date of Service: The patient was seen and examined on 08/19/2022  Subjective:   The patient was seen and examined this morning. Medically stable, still complaining of generalized weakness, dizziness upon ambulation Denies any abdominal pain nausea vomiting or diarrhea.  Brief Narrative:    Eric [name] is a pleasant 76 y.o. male with medical history significant for hyperlipidemia and bronchiectasis who presents to the emergency department with exertional dyspnea, lightheadedness, and general weakness.   He began experiencing these symptoms roughly a month ago and reports having extensive outpatient work-up including echocardiogram that was unrevealing.  Symptoms worsened significantly on 08/13/2022.  He does not look at his stool and is unable to comment on presence of melena or hematochezia.  He denies abdominal pain, nausea, or vomiting.  He uses Aleve 2 or 3 times weekly but no other NSAID or anticoagulant.  He denies any significant alcohol use.   [facility] ED Course: Upon arrival to the ED, patient is found to be afebrile and saturating well on room air with stable blood pressure and normal heart rate.  He is in sinus rhythm on EKG and chest x-ray is negative for acute cardiopulmonary disease.  Blood work notable for AST 48, ALT 80, and hemoglobin 10.1 (15.4 in August 2023).  ED MD reports melena on DRE.  Fecal occult blood testing is positive.    [facility] was consulted by the ED physician, 40 mg IV Protonix was administered, and the patient was transferred to [facility] for admission.     Assessment & Plan:     Assessment/Plan   Principal Problem:   GI bleed Active Problems:   Bronchiectasis without complication (HCC)    Elevated transaminase level   1. GI bleeding; anemia  -Remains hemodynamically stable -GI consulted, n.p.o. after midnight in a.m. 08/20/2022 -We will plan for colonoscopy yet  - ED physician reports seeing melena on DRE  - He is hemodynamically stable and initial Hgb is 10.1 (15.4 in August) >> 9.5 this a.m.   -GI consulted-following - Continue IV PPI, trend blood counts, transfuse if needed,    2. Elevated transaminases  - AST 48 and ALT 80 on admission (normal in 2018)  -Hepatitis panel negative, nonreactive, - Check viral hepatitis panel and RUQ [place], hold statin and azithromycin for now      3.  Acute on chronic anemia -Due to GI bleed, continue further investigation -Including GI work-up, anemia work-up (iron studies, TSH, folate, B12)     Latest Ref Rng & Units 08/19/2022   12:08 PM 08/19/2022   12:08 AM 08/18/2022    2:25 PM  CBC  WBC 4.0 - 10.5 K/uL 9.2  9.1  9.7   Hemoglobin 13.0 - 17.0 g/dL 9.5  9.5  10.1   Hematocrit 39.0 - 52.0 % 28.7  28.0  30.5   Platelets 150 - 400 K/uL 233  240  254  4.  Bronchiectasis  - Hold azithromycin while working up elevated LFTs           ------------------------------------------------------------------------------------------------------------------------------------------------  DVT prophylaxis:  SCDs Start: 08/18/22 2300   Code Status:   Code Status: Full Code  Family Communication: No family member present at bedside- attempt will be made to update daily The above findings and plan of care has been discussed with patient (and family)  in detail,  they expressed understanding and agreement of above. -Advance care planning has been discussed.   Admission status:   Status is: Inpatient Remains inpatient appropriate because: Needing further evaluation for GI bleed--further investigation per GI     Procedures:   No admission procedures for hospital encounter.   Antimicrobials:  Anti-infectives (From  admission, onward)    Start     Dose/Rate Route Frequency Ordered Stop   08/19/22 1000  azithromycin (ZITHROMAX) tablet 250 mg  Status:  Discontinued        250 mg Oral Daily 08/18/22 2333 08/18/22 2346        Medication:   atorvastatin  40 mg Oral Daily   pantoprazole (PROTONIX) IV  40 mg Intravenous Q12H   sodium chloride flush  3 mL Intravenous Q12H    acetaminophen **OR** acetaminophen, hydrOXYzine, ondansetron **OR** ondansetron (ZOFRAN) IV, oxyCODONE   Objective:   Vitals:   08/18/22 2228 08/19/22 0240 08/19/22 0600 08/19/22 1043  BP: 117/69 103/71 115/71 125/78  Pulse: 76 69 73 69  Resp: '18 18 16 20  '$ Temp: 98.6 F (37 C) 99.5 F (37.5 C) 98.6 F (37 C) 98.4 F (36.9 C)  TempSrc: Oral Oral Oral Oral  SpO2: 98% 97% 95% 96%  Weight: 83.9 kg     Height: 6' 1.5" (1.867 m)       Intake/Output Summary (Last 24 hours) at 08/19/2022 1241 Last data filed at 08/19/2022 1049 Gross per 24 hour  Intake 495.12 ml  Output 650 ml  Net -154.88 ml   Filed Weights   08/18/22 1407 08/18/22 2228  Weight: 79.4 kg 83.9 kg     Examination:   Physical Exam  Constitution:  Alert, cooperative, no distress,  Appears calm and comfortable  Psychiatric:   Normal and stable mood and affect, cognition intact,   HEENT:        Normocephalic, PERRL, otherwise with in Normal limits  Chest:         Chest symmetric Cardio vascular:  S1/S2, RRR, No murmure, No Rubs or Gallops  pulmonary: Clear to auscultation bilaterally, respirations unlabored, negative wheezes / crackles Abdomen: Soft, non-tender, non-distended, bowel sounds,no masses, no organomegaly Muscular skeletal: Limited exam - in bed, able to move all 4 extremities,   Neuro: CNII-XII intact. , normal motor and sensation, reflexes intact  Extremities: No pitting edema lower extremities, +2 pulses  Skin: Dry, warm to touch, negative for any Rashes, No open wounds Wounds: per nursing  documentation   ------------------------------------------------------------------------------------------------------------------------------------------    LABs:     Latest Ref Rng & Units 08/19/2022   12:08 PM 08/19/2022   12:08 AM 08/18/2022    2:25 PM  CBC  WBC 4.0 - 10.5 K/uL 9.2  9.1  9.7   Hemoglobin 13.0 - 17.0 g/dL 9.5  9.5  10.1   Hematocrit 39.0 - 52.0 % 28.7  28.0  30.5   Platelets 150 - 400 K/uL 233  240  254       Latest Ref Rng & Units 08/19/2022   12:08 AM 08/18/2022    2:25  PM 05/15/2022    7:24 AM  CMP  Glucose 70 - 99 mg/dL 165  154  107   BUN 8 - 23 mg/dL '17  16  18   '$ Creatinine 0.61 - 1.24 mg/dL 1.25  1.22  1.13   Sodium 135 - 145 mmol/L 140  141  141   Potassium 3.5 - 5.1 mmol/L 3.6  4.0  4.0   Chloride 98 - 111 mmol/L 108  106  107   CO2 22 - 32 mmol/L '26  27  25   '$ Calcium 8.9 - 10.3 mg/dL 8.4  9.4  9.6   Total Protein 6.5 - 8.1 g/dL 5.4  6.1    Total Bilirubin 0.3 - 1.2 mg/dL 0.5  0.4    Alkaline Phos 38 - 126 U/L 78  87    AST 15 - 41 U/L 44  48    ALT 0 - 44 U/L 71  80         Micro Results No results found for this or any previous visit (from the past 240 hour(s)).  Radiology Reports US Abdomen Limited RUQ (LIVER/GB)  Result Date: 08/19/2022 CLINICAL DATA:  Elevated transaminase EXAM: ULTRASOUND ABDOMEN LIMITED RIGHT UPPER QUADRANT COMPARISON:  01/27/2022 FINDINGS: Gallbladder: Gallstones: None Sludge: None Gallbladder Wall: Within normal limits Pericholecystic fluid: None Sonographic Murphy's Sign: Negative per technologist Common bile duct: Diameter: 2 mm Liver: Parenchymal echogenicity: Within normal limits Contours: Normal Lesions: 2 simple cysts are seen in the right liver lobe measuring 2.1 and 1.3 cm. These do not require further imaging follow-up. Portal vein: Patent.  Hepatopetal flow Other: None. IMPRESSION: No significant sonographic abnormality of the liver or gallbladder. Electronically Signed   By: Miachel Roux M.D.   On: 08/19/2022  06:29   DG Chest 2 View  Result Date: 08/18/2022 CLINICAL DATA:  Shortness of breath and dizziness for 1 month. EXAM: CHEST - 2 VIEW COMPARISON:  05/15/2022 FINDINGS: Midline trachea. Normal heart size. Tortuous thoracic aorta. No pleural effusion or pneumothorax. Mild left base scarring. IMPRESSION: No active cardiopulmonary disease. Electronically Signed   By: Abigail Miyamoto M.D.   On: 08/18/2022 14:44    SIGNED: Deatra James, MD, FHM. [facility],  Pager (please use [email or web address] to page/text) Please use Epic Secure Chat for non-urgent communication (7AM-7PM)  If 7PM-7AM, please contact night-coverage [email or web address], 08/19/2022, 12:41 PM

## 2022-08-19 NOTE — Anesthesia Preprocedure Evaluation (Signed)
Anesthesia Evaluation  Patient identified by MRN, date of birth, ID band Patient awake    Reviewed: Allergy & Precautions, NPO status , Patient's Chart, lab work & pertinent test results  History of Anesthesia Complications Negative for: history of anesthetic complications  Airway Mallampati: III  TM Distance: >3 FB Neck ROM: Full    Dental no notable dental hx.    Pulmonary neg shortness of breath, neg sleep apnea, neg COPD, neg recent URI bronchiectasis   Pulmonary exam normal breath sounds clear to auscultation       Cardiovascular negative cardio ROS  Rhythm:Regular Rate:Normal     Neuro/Psych Lumbar spondylosis with myelopathy    GI/Hepatic ,GERD  ,,Elevated LFTs GI bleed   Endo/Other  negative endocrine ROS    Renal/GU negative Renal ROS     Musculoskeletal  (+) Arthritis , Osteoarthritis,    Abdominal  (+) - obese  Peds  Hematology negative hematology ROS (+)   Anesthesia Other Findings 76 y.o. male with medical history significant for hyperlipidemia and bronchiectasis who presents to the emergency department with exertional dyspnea, lightheadedness, and general weakness.   He began experiencing these symptoms roughly a month ago and reports having extensive outpatient work-up including echocardiogram that was unrevealing.  Symptoms worsened significantly on 08/13/2022.  Blood work notable for AST 48, ALT 80, and hemoglobin 10.1 (15.4 in August 2023).  ED MD reports melena on DRE.  Fecal occult blood testing is positive.    Reproductive/Obstetrics                             Anesthesia Physical Anesthesia Plan  ASA: 2  Anesthesia Plan: MAC   Post-op Pain Management:    Induction: Intravenous  PONV Risk Score and Plan: 1 and Propofol infusion and TIVA  Airway Management Planned: Nasal Cannula and Natural Airway  Additional Equipment:   Intra-op Plan:   Post-operative  Plan:   Informed Consent: I have reviewed the patients History and Physical, chart, labs and discussed the procedure including the risks, benefits and alternatives for the proposed anesthesia with the patient or authorized representative who has indicated his/her understanding and acceptance.     Dental advisory given  Plan Discussed with: CRNA and Anesthesiologist  Anesthesia Plan Comments:        Anesthesia Quick Evaluation

## 2022-08-20 ENCOUNTER — Inpatient Hospital Stay (HOSPITAL_COMMUNITY): Payer: Medicare Other | Admitting: Anesthesiology

## 2022-08-20 ENCOUNTER — Encounter (HOSPITAL_COMMUNITY): Payer: Self-pay | Admitting: Family Medicine

## 2022-08-20 ENCOUNTER — Encounter (HOSPITAL_COMMUNITY)
Admission: EM | Disposition: A | Discharge status: Home / Self Care | Payer: Self-pay | Source: Home / Self Care | Attending: Internal Medicine

## 2022-08-20 DIAGNOSIS — K3189 Other diseases of stomach and duodenum: Secondary | ICD-10-CM

## 2022-08-20 DIAGNOSIS — D62 Acute posthemorrhagic anemia: Secondary | ICD-10-CM

## 2022-08-20 DIAGNOSIS — K2900 Acute gastritis without bleeding: Secondary | ICD-10-CM

## 2022-08-20 DIAGNOSIS — K449 Diaphragmatic hernia without obstruction or gangrene: Secondary | ICD-10-CM

## 2022-08-20 DIAGNOSIS — K259 Gastric ulcer, unspecified as acute or chronic, without hemorrhage or perforation: Secondary | ICD-10-CM

## 2022-08-20 DIAGNOSIS — M1990 Unspecified osteoarthritis, unspecified site: Secondary | ICD-10-CM

## 2022-08-20 HISTORY — PX: BIOPSY: SHX5522

## 2022-08-20 HISTORY — PX: ESOPHAGOGASTRODUODENOSCOPY (EGD) WITH PROPOFOL: SHX5813

## 2022-08-20 LAB — COMPREHENSIVE METABOLIC PANEL
ALT: 54 U/L — ABNORMAL HIGH (ref 0–44)
AST: 30 U/L (ref 15–41)
Albumin: 3.3 g/dL — ABNORMAL LOW (ref 3.5–5.0)
Alkaline Phosphatase: 79 U/L (ref 38–126)
Anion gap: 7 (ref 5–15)
BUN: 12 mg/dL (ref 8–23)
CO2: 26 mmol/L (ref 22–32)
Calcium: 8.6 mg/dL — ABNORMAL LOW (ref 8.9–10.3)
Chloride: 107 mmol/L (ref 98–111)
Creatinine, Ser: 1.2 mg/dL (ref 0.61–1.24)
GFR, Estimated: >60 mL/min (ref >60)
Glucose, Bld: 94 mg/dL (ref 70–99)
Potassium: 3.8 mmol/L (ref 3.5–5.1)
Sodium: 140 mmol/L (ref 135–145)
Total Bilirubin: 0.7 mg/dL (ref 0.3–1.2)
Total Protein: 5.5 g/dL — ABNORMAL LOW (ref 6.5–8.1)

## 2022-08-20 LAB — CBC
HCT: 29.3 % — ABNORMAL LOW (ref 39.0–52.0)
HCT: 29.5 % — ABNORMAL LOW (ref 39.0–52.0)
HCT: 30.1 % — ABNORMAL LOW (ref 39.0–52.0)
Hemoglobin: 9.8 g/dL — ABNORMAL LOW (ref 13.0–17.0)
Hemoglobin: 9.8 g/dL — ABNORMAL LOW (ref 13.0–17.0)
Hemoglobin: 9.9 g/dL — ABNORMAL LOW (ref 13.0–17.0)
MCH: 31 pg (ref 26.0–34.0)
MCH: 30.6 pg (ref 26.0–34.0)
MCH: 30.7 pg (ref 26.0–34.0)
MCHC: 33.4 g/dL (ref 30.0–36.0)
MCHC: 33.2 g/dL (ref 30.0–36.0)
MCHC: 32.9 g/dL (ref 30.0–36.0)
MCV: 92.7 fL (ref 80.0–100.0)
MCV: 92.2 fL (ref 80.0–100.0)
MCV: 93.2 fL (ref 80.0–100.0)
Platelets: 246 10*3/uL (ref 150–400)
Platelets: 255 10*3/uL (ref 150–400)
Platelets: 246 10*3/uL (ref 150–400)
RBC: 3.16 MIL/uL — ABNORMAL LOW (ref 4.22–5.81)
RBC: 3.2 MIL/uL — ABNORMAL LOW (ref 4.22–5.81)
RBC: 3.23 MIL/uL — ABNORMAL LOW (ref 4.22–5.81)
RDW: 13.9 % (ref 11.5–15.5)
RDW: 13.7 % (ref 11.5–15.5)
RDW: 13.7 % (ref 11.5–15.5)
WBC: 9.2 10*3/uL (ref 4.0–10.5)
WBC: 7.7 10*3/uL (ref 4.0–10.5)
WBC: 8.4 10*3/uL (ref 4.0–10.5)
nRBC: 0 % (ref 0.0–0.2)
nRBC: 0 % (ref 0.0–0.2)
nRBC: 0 % (ref 0.0–0.2)

## 2022-08-20 SURGERY — ESOPHAGOGASTRODUODENOSCOPY (EGD) WITH PROPOFOL
Anesthesia: Monitor Anesthesia Care

## 2022-08-20 MED ORDER — PROPOFOL 500 MG/50ML IV EMUL
INTRAVENOUS | Status: DC | PRN
  Administered 2022-08-20: 100 ug/kg/min via INTRAVENOUS

## 2022-08-20 MED ORDER — PROPOFOL 500 MG/50ML IV EMUL
INTRAVENOUS | Status: AC
  Filled 2022-08-20: qty 50

## 2022-08-20 MED ORDER — SODIUM CHLORIDE 0.9 % IV SOLN
200.0000 mg | INTRAVENOUS | Status: DC
  Administered 2022-08-20 – 2022-08-22 (×3): 200 mg via INTRAVENOUS
  Filled 2022-08-20 (×3): qty 10

## 2022-08-20 MED ORDER — PROPOFOL 10 MG/ML IV BOLUS
INTRAVENOUS | Status: DC | PRN
  Administered 2022-08-20: 20 mg via INTRAVENOUS

## 2022-08-20 MED ORDER — SODIUM CHLORIDE 0.9 % IV SOLN: INTRAVENOUS | Status: DC

## 2022-08-20 MED ORDER — LACTATED RINGERS IV SOLN: INTRAVENOUS | Status: DC

## 2022-08-20 SURGICAL SUPPLY — 15 items

## 2022-08-20 NOTE — Progress Notes (Signed)
Mobility Specialist Cancellation/Refusal Note:  Pt declined mobility at this time. Pt states he has been feeling dizzy all morning. Will check back as schedule permits.      [facility]

## 2022-08-20 NOTE — Interval H&P Note (Signed)
History and Physical Interval Note:  08/20/2022 10:18 AM  Eric [name]  has presented today for surgery, with the diagnosis of GI Bleed.  The various methods of treatment have been discussed with the patient and family. After consideration of risks, benefits and other options for treatment, the patient has consented to  Procedure(s): ESOPHAGOGASTRODUODENOSCOPY (EGD) WITH PROPOFOL (N/A) as a surgical intervention.  The patient's history has been reviewed, patient examined, no change in status, stable for surgery.  I have reviewed the patient's chart and labs.  Questions were answered to the patient's satisfaction.     Lear Ng

## 2022-08-20 NOTE — Anesthesia Postprocedure Evaluation (Signed)
Anesthesia Post Note  Patient: Forrester A Senseney  Procedure(s) Performed: ESOPHAGOGASTRODUODENOSCOPY (EGD) WITH PROPOFOL BIOPSY     Patient location during evaluation: PACU Anesthesia Type: MAC Level of consciousness: awake and alert Pain management: pain level controlled Vital Signs Assessment: post-procedure vital signs reviewed and stable Respiratory status: spontaneous breathing, nonlabored ventilation and respiratory function stable Cardiovascular status: stable and blood pressure returned to baseline Postop Assessment: no apparent nausea or vomiting Anesthetic complications: no   No notable events documented.  Last Vitals:  Vitals:   08/20/22 1050 08/20/22 1100  BP: 115/69 136/70  Pulse: 73 71  Resp: 19 14  Temp:    SpO2: 100% 98%    Last Pain:  Vitals:   08/20/22 1100  TempSrc:   PainSc: 0-No pain                 Nilda Simmer

## 2022-08-20 NOTE — Transfer of Care (Signed)
Immediate Anesthesia Transfer of Care Note  Patient: Eric [name]  Procedure(s) Performed: ESOPHAGOGASTRODUODENOSCOPY (EGD) WITH PROPOFOL BIOPSY  Patient Location: PACU  Anesthesia Type:MAC  Level of Consciousness: sedated  Airway & Oxygen Therapy: Patient Spontanous Breathing and Patient connected to face mask oxygen  Post-op Assessment: Report given to RN and Post -op Vital signs reviewed and stable  Post vital signs: Reviewed and stable  Last Vitals:  Vitals Value Taken Time  BP    Temp    Pulse    Resp    SpO2      Last Pain:  Vitals:   08/20/22 0956  TempSrc: Temporal  PainSc: 0-No pain      Patients Stated Pain Goal: 3 (09/32/35 5732)  Complications: No notable events documented.

## 2022-08-20 NOTE — Op Note (Signed)
[facility] Patient Name: Eric [name] Procedure Date: 08/20/2022 MRN: [id number] Attending MD: Lear Ng , MD, [id number] Date of Birth: May 24, 1946 CSN: [id number] Age: 76 Admit Type: Inpatient Procedure:                Upper GI endoscopy Indications:              Suspected upper gastrointestinal bleeding, Acute                            post hemorrhagic anemia, Melena Providers:                Lear Ng, MD, Kary Kos, RN,                            William Dalton, Technician, Daine Gravel, RN Referring MD:             hospital team Medicines:                Propofol per Anesthesia, Monitored Anesthesia Care Complications:            No immediate complications. Estimated Blood Loss:     Estimated blood loss was minimal. Procedure:                Pre-Anesthesia Assessment:                           - Prior to the procedure, a History and Physical                            was performed, and patient medications and                            allergies were reviewed. The patient's tolerance of                            previous anesthesia was also reviewed. The risks                            and benefits of the procedure and the sedation                            options and risks were discussed with the patient.                            All questions were answered, and informed consent                            was obtained. Prior Anticoagulants: The patient has                            taken no anticoagulant or antiplatelet agents. ASA                            Grade Assessment: II - A patient with mild systemic  disease. After reviewing the risks and benefits,                            the patient was deemed in satisfactory condition to                            undergo the procedure.                           After obtaining informed consent, the endoscope was                            passed under  direct vision. Throughout the                            procedure, the patient's blood pressure, pulse, and                            oxygen saturations were monitored continuously. The                            [id number]-H190 ([id number]) Olympus endoscope was introduced                            through the mouth, and advanced to the second part                            of duodenum. The upper GI endoscopy was                            accomplished without difficulty. The patient                            tolerated the procedure well. Scope In: Scope Out: Findings:      The examined esophagus was normal.      The Z-line was found 40 cm from the incisors.      A 5 cm hiatal hernia was present.      One non-bleeding cratered gastric ulcer with no stigmata of bleeding was       found on the greater curvature of the stomach. The lesion was 6 mm in       largest dimension.      Segmental moderate inflammation characterized by congestion (edema) and       erythema was found on the greater curvature of the stomach. Biopsies       were taken with a cold forceps for histology. Estimated blood loss was       minimal.      Segmental moderate inflammation characterized by congestion (edema) and       erythema was found in the gastric antrum. Biopsies were taken with a       cold forceps for histology. Estimated blood loss was minimal.      A 5 cm hiatal hernia was present.      Patchy mild mucosal changes characterized by congestion and erythema       were found in the duodenal bulb.  The exam of the duodenum was otherwise normal. Impression:               - Normal esophagus.                           - Z-line, 40 cm from the incisors.                           - 5 cm hiatal hernia.                           - Non-bleeding gastric ulcer with no stigmata of                            bleeding.                           - Gastritis. Biopsied.                           - Acute gastritis.  Biopsied.                           - 5 cm hiatal hernia.                           - Mucosal changes in the duodenum. Moderate Sedation:      N/A - MAC procedure Recommendation:           - Clear liquid diet.                           - Await pathology results.                           - Observe patient's clinical course.                           - Use Protonix (pantoprazole) 40 mg IV BID. Procedure Code(s):        --- Professional ---                           [phone], Esophagogastroduodenoscopy, flexible,                            transoral; with biopsy, single or multiple Diagnosis Code(s):        --- Professional ---                           K25.9, Gastric ulcer, unspecified as acute or                            chronic, without hemorrhage or perforation                           K29.70, Gastritis, unspecified, without bleeding  K29.00, Acute gastritis without bleeding                           K92.1, Melena (includes Hematochezia)                           D62, Acute posthemorrhagic anemia                           K31.89, Other diseases of stomach and duodenum                           K44.9, Diaphragmatic hernia without obstruction or                            gangrene CPT copyright 2022 [employer]. All rights reserved. The codes documented in this report are preliminary and upon coder review may  be revised to meet current compliance requirements. Lear Ng, MD 08/20/2022 10:47:57 AM This report has been signed electronically. Number of Addenda: 0

## 2022-08-20 NOTE — Progress Notes (Addendum)
Consultation Progress Note   Patient: Eric [name] RSW:[id number] DOB: 05/19/1946 DOA: 08/18/2022 DOS: the patient was seen and examined on 08/20/2022 Primary service: Lennon Boutwell, Manfred Shirts, MD  Brief hospital course:  Eric [name] is a pleasant 76 y.o. male with medical history significant for hyperlipidemia and bronchiectasis who presents to the emergency department with exertional dyspnea, lightheadedness, and general weakness.   He began experiencing these symptoms roughly a month ago and reports having extensive outpatient work-up including echocardiogram that was unrevealing.  Symptoms worsened significantly on 08/13/2022.  He does not look at his stool and is unable to comment on presence of melena or hematochezia.  He denies abdominal pain, nausea, or vomiting.  He uses Aleve 2 or 3 times weekly but no other NSAID or anticoagulant.  He denies any significant alcohol use.   [facility] ED Course: Upon arrival to the ED, patient is found to be afebrile and saturating well on room air with stable blood pressure and normal heart rate.  He is in sinus rhythm on EKG and chest x-ray is negative for acute cardiopulmonary disease.  Blood work notable for AST 48, ALT 80, and hemoglobin 10.1 (15.4 in August 2023).  ED MD reports melena on DRE.  Fecal occult blood testing is positive.    [facility] was consulted by the ED physician, 40 mg IV Protonix was administered, and the patient was transferred to [facility] for admission.  Assessment/Plan    1. GI bleeding; anemia  - BUN is normal and there are no upper GI symptoms but ED physician reports seeing melena on DRE  - He is hemodynamically stable and initial Hgb is 10.1 (15.4 in August)  - Appreciate GI consultation  - Continue IV PPI, trend blood counts, transfuse if needed, keep NPO pending GI evaluation     2. Elevated transaminases  - AST 48 and ALT 80 on admission (normal in 2018)  - Check viral hepatitis panel and RUQ [place],  hold statin and azithromycin for now      3. Bronchiectasis  - Hold azithromycin while working up elevated LFTs     Assessment and Plan: No notes have been filed under this hospital service. Service: Hospitalist       [facility] will continue to follow the patient.  Subjective: Patient seen at bedside this morning, he was scheduled for EGD.  He Reports feeling dizzy with standing/walking.  Family was at bedside.  Spoke with GI after EGD patient had small nonbleeding ulcer biopsies were obtained.    Updated family and plan is to place patient on IV iron given his low iron levels.  Hemoglobin remains low but  stable.  Physical Exam: Constitution:  Alert, cooperative, no distress,  Appears calm and comfortable  Psychiatric:   Normal and stable mood and affect, cognition intact,   HEENT:        Normocephalic, PERRL, otherwise with in Normal limits  Chest:         Chest symmetric Cardio vascular:  S1/S2, RRR, No murmure, No Rubs or Gallops  pulmonary: Clear to auscultation bilaterally, respirations unlabored, negative wheezes / crackles Abdomen: Soft, non-tender, non-distended, bowel sounds,no masses, no organomegaly Muscular skeletal: Limited exam - in bed, able to move all 4 extremities,   Neuro: CNII-XII intact. , normal motor and sensation, reflexes intact  Extremities: No pitting edema lower extremities, +2 pulses  Skin: Dry, warm to touch, negative for any Rashes, No open wounds Wounds: per nursing documentation   Vitals:  08/20/22 1040 08/20/22 1050 08/20/22 1100 08/20/22 1121  BP: (!) 112/59 115/69 136/70 120/84  Pulse: 81 73 71 63  Resp: (!) '22 19 14 16  '$ Temp:    98.1 F (36.7 C)  TempSrc:    Oral  SpO2: 100% 100% 98%   Weight:      Height:        Data Reviewed:  There are no new results to review at this time.  Family Communication: Wife and daughter at bedside  Time spent: 15 minutes.  Author: Cristela Felt, MD 08/20/2022 2:09 PM  For on call review  [email or web address].

## 2022-08-21 LAB — CBC
HCT: 28.5 % — ABNORMAL LOW (ref 39.0–52.0)
Hemoglobin: 9.7 g/dL — ABNORMAL LOW (ref 13.0–17.0)
MCH: 30.8 pg (ref 26.0–34.0)
MCHC: 34 g/dL (ref 30.0–36.0)
MCV: 90.5 fL (ref 80.0–100.0)
Platelets: 237 10*3/uL (ref 150–400)
RBC: 3.15 MIL/uL — ABNORMAL LOW (ref 4.22–5.81)
RDW: 13.6 % (ref 11.5–15.5)
WBC: 8.3 10*3/uL (ref 4.0–10.5)
nRBC: 0 % (ref 0.0–0.2)

## 2022-08-21 LAB — COMPREHENSIVE METABOLIC PANEL
ALT: 51 U/L — ABNORMAL HIGH (ref 0–44)
AST: 28 U/L (ref 15–41)
Albumin: 3.3 g/dL — ABNORMAL LOW (ref 3.5–5.0)
Alkaline Phosphatase: 87 U/L (ref 38–126)
Anion gap: 5 (ref 5–15)
BUN: 15 mg/dL (ref 8–23)
CO2: 26 mmol/L (ref 22–32)
Calcium: 8.6 mg/dL — ABNORMAL LOW (ref 8.9–10.3)
Chloride: 110 mmol/L (ref 98–111)
Creatinine, Ser: 1.23 mg/dL (ref 0.61–1.24)
GFR, Estimated: >60 mL/min (ref >60)
Glucose, Bld: 94 mg/dL (ref 70–99)
Potassium: 3.8 mmol/L (ref 3.5–5.1)
Sodium: 141 mmol/L (ref 135–145)
Total Bilirubin: 0.8 mg/dL (ref 0.3–1.2)
Total Protein: 5.7 g/dL — ABNORMAL LOW (ref 6.5–8.1)

## 2022-08-21 MED ORDER — SODIUM CHLORIDE 0.9 % IV SOLN: INTRAVENOUS | Status: DC

## 2022-08-21 MED ORDER — PANTOPRAZOLE SODIUM 40 MG PO TBEC
40.0000 mg | DELAYED_RELEASE_TABLET | Freq: Two times a day (BID) | ORAL | Status: DC
  Administered 2022-08-22: 40 mg via ORAL
  Filled 2022-08-21: qty 1

## 2022-08-21 NOTE — Care Management Important Message (Signed)
Important Message  Patient Details IM Letter given to the Patient. Name: Eric [name] MRN: [id number] Date of Birth: Oct 25, 1945   Medicare Important Message Given:  Yes     Kerin Salen 08/21/2022, 11:29 AM

## 2022-08-21 NOTE — Progress Notes (Signed)
[facility] Progress Note  Eric [name] 76 y.o. 04-06-1946  Subjective: Patient seen and examined at bedside.  Has felt a little bit lightheaded when he gets up too quickly but otherwise feels okay.  Denies nausea, vomiting, fever, chills, abdominal pain.  Reports having a normal stool overnight.  Denies melena or hematochezia.  ROS : Review of Systems  Constitutional:  Negative for chills and fever.  Gastrointestinal:  Negative for abdominal pain, blood in stool, constipation, diarrhea, heartburn, melena, nausea and vomiting.  Genitourinary:  Negative for dysuria and urgency.      Objective: Vital signs in last 24 hours: Vitals:   08/21/22 0459 08/21/22 1000  BP: 102/68 (!) 94/55  Pulse: 77 75  Resp: 18 16  Temp: 98.6 F (37 C) 98.2 F (36.8 C)  SpO2: 98% 97%    Physical Exam:  General:  Alert, cooperative, no distress, appears stated age  Head:  Normocephalic, without obvious abnormality, atraumatic  Eyes:  Anicteric sclera, EOM's intact  Lungs:   Clear to auscultation bilaterally, respirations unlabored  Heart:  Regular rate and rhythm, S1, S2 normal  Abdomen:   Soft, non-tender, bowel sounds active all four quadrants  Extremities: Extremities normal, atraumatic, no  edema  Pulses: 2+ and symmetric    Lab Results: Recent Labs    08/19/22 0008 08/20/22 0434 08/21/22 0351  NA 140 140 141  K 3.6 3.8 3.8  CL 108 107 110  CO2 '26 26 26  '$ GLUCOSE 165* 94 94  BUN '17 12 15  '$ CREATININE 1.25* 1.20 1.23  CALCIUM 8.4* 8.6* 8.6*  MG 1.9  --   --    Recent Labs    08/20/22 0434 08/21/22 0351  AST 30 28  ALT 54* 51*  ALKPHOS 79 87  BILITOT 0.7 0.8  PROT 5.5* 5.7*  ALBUMIN 3.3* 3.3*   Recent Labs    08/18/22 1425 08/19/22 0008 08/20/22 2250 08/21/22 0351  WBC 9.7   < > 8.4 8.3  NEUTROABS 6.4  --   --   --   HGB 10.1*   < > 9.9* 9.7*  HCT 30.5*   < > 30.1* 28.5*  MCV 91.3   < > 93.2 90.5  PLT 254   < > 246 237   < > = values in this interval  not displayed.   No results for input(s): "LABPROT", "INR" in the last 72 hours.   Assessment Anemia, melena, suspected UGI bleed - EGD 08/20/2022 - Normal esophagus, 5 cm hiatal hernia, nonbleeding gastric ulcer with no stigmata of bleeding, acute gastritis - Hemoglobin 9.7 today, stable - Normal BUN, creatinine  Plan: Hemoglobin stable, no evidence of active bleeding.  Continue Protonix BID. Will need to follow up with Dr. Alessandra Bevels outpatient. [facility] will sign off. Please contact us if we can be of any further assistance during this hospital stay.   Angelique Holm PA-C 08/21/2022, 11:39 AM  Contact #  [phone]

## 2022-08-21 NOTE — Progress Notes (Signed)
Consultation Progress Note   Patient: Eric [name] Wales SJG:[id number] DOB: [birth year] DOA: 08/18/2022 DOS: the patient was seen and examined on 08/21/2022 Primary service: Tosha Belgarde, Manfred Shirts, MD  Brief hospital course:  Arth Nicastro Racz is a pleasant 76 y.o. male with medical history significant for hyperlipidemia and bronchiectasis who presents to the emergency department with exertional dyspnea, lightheadedness, and general weakness.   He began experiencing these symptoms roughly a month ago and reports having extensive outpatient work-up including echocardiogram that was unrevealing.  Symptoms worsened significantly on 08/13/2022.  He does not look at his stool and is unable to comment on presence of melena or hematochezia.  He denies abdominal pain, nausea, or vomiting.  He uses Aleve 2 or 3 times weekly but no other NSAID or anticoagulant.  He denies any significant alcohol use.   [facility] ED Course: Upon arrival to the ED, patient is found to be afebrile and saturating well on room air with stable blood pressure and normal heart rate.  He is in sinus rhythm on EKG and chest x-ray is negative for acute cardiopulmonary disease.  Blood work notable for AST 48, ALT 80, and hemoglobin 10.1 (15.4 in August 2023).  ED MD reports melena on DRE.  Fecal occult blood testing is positive.    [facility] was consulted by the ED physician, 40 mg IV Protonix was administered, and the patient was transferred to [facility] for admission.  Assessment/Plan    1. GI bleeding; anemia  - BUN is normal and there are no upper GI symptoms but ED physician reports seeing melena on DRE  - He is hemodynamically stable and initial Hgb is 10.1 (15.4 in August)  - Appreciate GI consultation  - Continue IV PPI, trend blood counts, transfuse if needed, keep NPO pending GI evaluation     2. Elevated transaminases  - AST 48 and ALT 80 on admission (normal in 2018)  - Check viral hepatitis panel and RUQ [place],  hold statin and azithromycin for now      3. Bronchiectasis  - Hold azithromycin while working up elevated LFTs     Assessment and Plan: 11/9 Orthostatic hypotension-likely etiology of dizziness.  Suspect secondary to dehydration.  Given IV fluids.  ReCheck orthostatics  Iron deficiency anemia-continue with IV iron.       [facility] will continue to follow the patient.  Subjective: Feels better less dizziness with standing and ambulation. Orthostatic this morning, started on IV fluids  Physical Exam: Constitution:  Alert, cooperative, no distress,  Appears calm and comfortable  Psychiatric:   Normal and stable mood and affect, cognition intact,   HEENT:        Normocephalic, PERRL, otherwise with in Normal limits  Chest:         Chest symmetric Cardio vascular:  S1/S2, RRR, No murmure, No Rubs or Gallops  pulmonary: Clear to auscultation bilaterally, respirations unlabored, negative wheezes / crackles Abdomen: Soft, non-tender, non-distended, bowel sounds,no masses, no organomegaly Muscular skeletal: Limited exam - in bed, able to move all 4 extremities,   Neuro: CNII-XII intact. , normal motor and sensation, reflexes intact  Extremities: No pitting edema lower extremities, +2 pulses  Skin: Dry, warm to touch, negative for any Rashes, No open wounds Wounds: per nursing documentation   Vitals:   08/20/22 2030 08/21/22 0459 08/21/22 1000 08/21/22 1205  BP:  102/68 (!) 94/55 99/66  Pulse:  77 75 72  Resp: '18 18 16   '$ Temp: 98.4 F (36.9 C)  98.6 F (37 C) 98.2 F (36.8 C) 99 F (37.2 C)  TempSrc: Oral Oral Oral Oral  SpO2: 96% 98% 97% 95%  Weight:      Height:        Data Reviewed:  There are no new results to review at this time.  Family Communication:   Time spent: 15 minutes.  Author: Cristela Felt, MD 08/21/2022 12:13 PM  For on call review [email or web address]

## 2022-08-22 LAB — COMPREHENSIVE METABOLIC PANEL
ALT: 62 U/L — ABNORMAL HIGH (ref 0–44)
AST: 41 U/L (ref 15–41)
Albumin: 3.3 g/dL — ABNORMAL LOW (ref 3.5–5.0)
Alkaline Phosphatase: 83 U/L (ref 38–126)
Anion gap: 4 — ABNORMAL LOW (ref 5–15)
BUN: 16 mg/dL (ref 8–23)
CO2: 24 mmol/L (ref 22–32)
Calcium: 8.5 mg/dL — ABNORMAL LOW (ref 8.9–10.3)
Chloride: 114 mmol/L — ABNORMAL HIGH (ref 98–111)
Creatinine, Ser: 1.13 mg/dL (ref 0.61–1.24)
GFR, Estimated: >60 mL/min (ref >60)
Glucose, Bld: 92 mg/dL (ref 70–99)
Potassium: 3.9 mmol/L (ref 3.5–5.1)
Sodium: 142 mmol/L (ref 135–145)
Total Bilirubin: 0.5 mg/dL (ref 0.3–1.2)
Total Protein: 6 g/dL — ABNORMAL LOW (ref 6.5–8.1)

## 2022-08-22 LAB — SURGICAL PATHOLOGY

## 2022-08-22 LAB — CBC
HCT: 29 % — ABNORMAL LOW (ref 39.0–52.0)
Hemoglobin: 9.3 g/dL — ABNORMAL LOW (ref 13.0–17.0)
MCH: 30.7 pg (ref 26.0–34.0)
MCHC: 32.1 g/dL (ref 30.0–36.0)
MCV: 95.7 fL (ref 80.0–100.0)
Platelets: 190 10*3/uL (ref 150–400)
RBC: 3.03 MIL/uL — ABNORMAL LOW (ref 4.22–5.81)
RDW: 13.9 % (ref 11.5–15.5)
WBC: 6.9 10*3/uL (ref 4.0–10.5)
nRBC: 0.4 % — ABNORMAL HIGH (ref 0.0–0.2)

## 2022-08-22 MED ORDER — POLYETHYLENE GLYCOL 3350 17 G PO PACK: 17.0000 g | PACK | Freq: Every day | ORAL | Status: DC

## 2022-08-22 MED ORDER — FERROUS SULFATE 325 (65 FE) MG PO TBEC: 325.0000 mg | DELAYED_RELEASE_TABLET | Freq: Two times a day (BID) | ORAL | 3 refills | Status: AC

## 2022-08-22 MED ORDER — PANTOPRAZOLE SODIUM 40 MG PO TBEC: 40.0000 mg | DELAYED_RELEASE_TABLET | Freq: Two times a day (BID) | ORAL | 1 refills | Status: AC

## 2022-08-22 NOTE — Discharge Summary (Addendum)
Physician Discharge Summary   Patient: Eric [name] MRN: [id number] DOB: November 21, 1945  Admit date:     08/18/2022  Discharge date: 08/22/22  Discharge Physician: Manfred Shirts Chanah Tidmore   PCP: Corliss Blacker, MD   Recommendations at discharge:    Follow up with PCP and GI at DC.  Discharge Diagnoses: Principal Problem:   GI bleed Active Problems:   Bronchiectasis without complication (HCC)   Elevated transaminase level  Resolved Problems:   * No resolved hospital problems. *  Hospital Course: Eric [name] is a pleasant 76 y.o. male with medical history significant for hyperlipidemia and bronchiectasis who presents to the emergency department with exertional dyspnea, lightheadedness, and general weakness.   He began experiencing these symptoms roughly a month ago and reports having extensive outpatient work-up including echocardiogram that was unrevealing.  Symptoms worsened significantly on 08/13/2022.  He does not look at his stool and is unable to comment on presence of melena or hematochezia.  He denies abdominal pain, nausea, or vomiting.  He uses Aleve 2 or 3 times weekly but no other NSAID or anticoagulant.  He denies any significant alcohol use.   [facility] ED Course: Upon arrival to the ED, patient is found to be afebrile and saturating well on room air with stable blood pressure and normal heart rate.  He is in sinus rhythm on EKG and chest x-ray is negative for acute cardiopulmonary disease.  Blood work notable for AST 48, ALT 80, and hemoglobin 10.1 (15.4 in August 2023).  ED MD reports melena on DRE.  Fecal occult blood testing is positive.    [facility] was consulted by the ED physician, 40 mg IV Protonix was administered, and the patient was transferred to [facility] for admission.    Assessment and Plan: 1. GI bleeding; Status post EGD, which revealed a non bleeding ulcer. Hb remained stable, he was placed on protonics BID. No transfusion was  required. Patient to follow up with GI for biopsy report. Dc with ppi bid  -Iron deficiency Anemia-Managed with IV iron, dc on oral iron  2. Elevated transaminases  - AST 48 and ALT 80 on admission (normal in 2018)  -improved monitor trend with repeat labs   3. Bronchiectasis  -Continue azithromycin    4. Orthostatic hypotension-likely etiology of dizziness.  Suspect secondary to dehydration.  Given IV fluids.  Repeat orthostatics are negative.       Consultants: GI Procedures performed:   Disposition: Home Diet recommendation:  Carb modified diet DISCHARGE MEDICATION: Allergies as of 08/22/2022   No Known Allergies      Medication List     TAKE these medications    atorvastatin 40 MG tablet Commonly known as: LIPITOR Take 40 mg by mouth daily.   azithromycin 250 MG tablet Commonly known as: ZITHROMAX Take 250 mg by mouth daily.   ferrous sulfate 325 (65 FE) MG EC tablet Take 1 tablet (325 mg total) by mouth 2 (two) times daily.   hydrOXYzine 25 MG tablet Commonly known as: ATARAX Take 25 mg by mouth in the morning and at bedtime.   OVER THE COUNTER MEDICATION Take 1 capsule by mouth daily. Vitamin B po   OVER THE COUNTER MEDICATION Take 1 tablet by mouth at bedtime. Condroitin PO   pantoprazole 40 MG tablet Commonly known as: Protonix Take 1 tablet (40 mg total) by mouth 2 (two) times daily.        Follow-up Information     Wilford Corner,  MD Follow up in 1 week(s).   Specialty: Gastroenterology Contact information: [address] [place] [address] [phone]                Discharge Exam:\  Constitution:  Alert, cooperative, no distress,  Appears calm and comfortable  Psychiatric:   Normal and stable mood and affect, cognition intact,   HEENT:        Normocephalic, PERRL, otherwise with in Normal limits  Chest:         Chest symmetric Cardio vascular:  S1/S2, RRR, No murmure, No Rubs or Gallops  pulmonary:  Clear to auscultation bilaterally, respirations unlabored, negative wheezes / crackles Abdomen: Soft, non-tender, non-distended, bowel sounds,no masses, no organomegaly Muscular skeletal: Limited exam - in bed, able to move all 4 extremities,   Neuro: CNII-XII intact. , normal motor and sensation, reflexes intact  Extremities: No pitting edema lower extremities, +2 pulses  Skin: Dry, warm to touch, negative for any Rashes, No open wounds Wounds: per nursing documentation Filed Weights   08/18/22 1407 08/18/22 2228  Weight: 79.4 kg 83.9 kg     Condition at discharge: good  The results of significant diagnostics from this hospitalization (including imaging, microbiology, ancillary and laboratory) are listed below for reference.   Imaging Studies: US Abdomen Limited RUQ (LIVER/GB)  Result Date: 08/19/2022 CLINICAL DATA:  Elevated transaminase EXAM: ULTRASOUND ABDOMEN LIMITED RIGHT UPPER QUADRANT COMPARISON:  01/27/2022 FINDINGS: Gallbladder: Gallstones: None Sludge: None Gallbladder Wall: Within normal limits Pericholecystic fluid: None Sonographic Murphy's Sign: Negative per technologist Common bile duct: Diameter: 2 mm Liver: Parenchymal echogenicity: Within normal limits Contours: Normal Lesions: 2 simple cysts are seen in the right liver lobe measuring 2.1 and 1.3 cm. These do not require further imaging follow-up. Portal vein: Patent.  Hepatopetal flow Other: None. IMPRESSION: No significant sonographic abnormality of the liver or gallbladder. Electronically Signed   By: Miachel Roux M.D.   On: 08/19/2022 06:29   DG Chest 2 View  Result Date: 08/18/2022 CLINICAL DATA:  Shortness of breath and dizziness for 1 month. EXAM: CHEST - 2 VIEW COMPARISON:  05/15/2022 FINDINGS: Midline trachea. Normal heart size. Tortuous thoracic aorta. No pleural effusion or pneumothorax. Mild left base scarring. IMPRESSION: No active cardiopulmonary disease. Electronically Signed   By: Abigail Miyamoto M.D.   On:  08/18/2022 14:44    Microbiology: Results for orders placed or performed during the hospital encounter of 04/04/19  SARS Coronavirus 2 (Performed in [facility] hospital lab)     Status: None   Collection Time: 04/04/19 10:28 AM   Specimen: Nasal Swab  Result Value Ref Range Status   SARS Coronavirus 2 NEGATIVE NEGATIVE Final    Comment: (NOTE) SARS-CoV-2 target nucleic acids are NOT DETECTED. The SARS-CoV-2 RNA is generally detectable in upper and lower respiratory specimens during the acute phase of infection. Negative results do not preclude SARS-CoV-2 infection, do not rule out co-infections with other pathogens, and should not be used as the sole basis for treatment or other patient management decisions. Negative results must be combined with clinical observations, patient history, and epidemiological information. The expected result is Negative. Fact Sheet for Patients: TrashEliminator.se Fact Sheet for Healthcare Providers: WhoisBlogging.ch This test is not yet approved or cleared by the [place] FDA and  has been authorized for detection and/or diagnosis of SARS-CoV-2 by FDA under an Emergency Use Authorization (EUA). This EUA will remain  in effect (meaning this test can be used) for the duration of the  COVID-19 declaration under Section 56 4(b)(1) of the Act, 21 U.S.C. section 360bbb-3(b)(1), unless the authorization is terminated or revoked sooner. Performed at [facility], [place] [address] [place], [place] [address]     Labs: CBC: Recent Labs  Lab 08/18/22 1425 08/19/22 0008 08/20/22 0434 08/20/22 1139 08/20/22 2250 08/21/22 0351 08/22/22 0344  WBC 9.7   < > 9.2 7.7 8.4 8.3 6.9  NEUTROABS 6.4  --   --   --   --   --   --   HGB 10.1*   < > 9.8* 9.8* 9.9* 9.7* 9.3*  HCT 30.5*   < > 29.3* 29.5* 30.1* 28.5* 29.0*  MCV 91.3   < > 92.7 92.2 93.2 90.5 95.7  PLT 254   < > 246 255 246 237 190   < > =  values in this interval not displayed.   Basic Metabolic Panel: Recent Labs  Lab 08/18/22 1425 08/19/22 0008 08/20/22 0434 08/21/22 0351 08/22/22 0731  NA 141 140 140 141 142  K 4.0 3.6 3.8 3.8 3.9  CL 106 108 107 110 114*  CO2 '27 26 26 26 24  '$ GLUCOSE 154* 165* 94 94 92  BUN '16 17 12 15 16  '$ CREATININE 1.22 1.25* 1.20 1.23 1.13  CALCIUM 9.4 8.4* 8.6* 8.6* 8.5*  MG  --  1.9  --   --   --    Liver Function Tests: Recent Labs  Lab 08/18/22 1425 08/19/22 0008 08/20/22 0434 08/21/22 0351 08/22/22 0731  AST 48* 44* 30 28 41  ALT 80* 71* 54* 51* 62*  ALKPHOS 87 78 79 87 83  BILITOT 0.4 0.5 0.7 0.8 0.5  PROT 6.1* 5.4* 5.5* 5.7* 6.0*  ALBUMIN 4.2 3.2* 3.3* 3.3* 3.3*   CBG: No results for input(s): "GLUCAP" in the last 168 hours.  Discharge time spent: greater than 30 minutes.  Signed: Cristela Felt, MD [facility] 08/22/2022

## 2022-08-23 ENCOUNTER — Encounter (HOSPITAL_COMMUNITY): Payer: Self-pay | Admitting: Gastroenterology

## 2022-08-30 ENCOUNTER — Emergency Department (HOSPITAL_BASED_OUTPATIENT_CLINIC_OR_DEPARTMENT_OTHER)
Admission: EM | Admit: 2022-08-30 | Discharge: 2022-08-30 | Disposition: A | Discharge status: Home / Self Care | Payer: Medicare Other | Attending: Emergency Medicine | Admitting: Emergency Medicine

## 2022-08-30 DIAGNOSIS — K921 Melena: Secondary | ICD-10-CM | POA: Diagnosis not present

## 2022-08-30 DIAGNOSIS — R531 Weakness: Secondary | ICD-10-CM | POA: Insufficient documentation

## 2022-08-30 DIAGNOSIS — R42 Dizziness and giddiness: Secondary | ICD-10-CM | POA: Insufficient documentation

## 2022-08-30 LAB — CBC WITH DIFFERENTIAL/PLATELET
Abs Immature Granulocytes: 0.04 10*3/uL (ref 0.00–0.07)
Basophils Absolute: 0.1 10*3/uL (ref 0.0–0.1)
Basophils Relative: 1 %
Eosinophils Absolute: 0.3 10*3/uL (ref 0.0–0.5)
Eosinophils Relative: 4 %
HCT: 33.9 % — ABNORMAL LOW (ref 39.0–52.0)
Hemoglobin: 11.2 g/dL — ABNORMAL LOW (ref 13.0–17.0)
Immature Granulocytes: 1 %
Lymphocytes Relative: 43 %
Lymphs Abs: 3.4 10*3/uL (ref 0.7–4.0)
MCH: 30.5 pg (ref 26.0–34.0)
MCHC: 33 g/dL (ref 30.0–36.0)
MCV: 92.4 fL (ref 80.0–100.0)
Monocytes Absolute: 0.6 10*3/uL (ref 0.1–1.0)
Monocytes Relative: 8 %
Neutro Abs: 3.4 10*3/uL (ref 1.7–7.7)
Neutrophils Relative %: 43 %
Platelets: 240 10*3/uL (ref 150–400)
RBC: 3.67 MIL/uL — ABNORMAL LOW (ref 4.22–5.81)
RDW: 14.6 % (ref 11.5–15.5)
WBC: 7.9 10*3/uL (ref 4.0–10.5)
nRBC: 0 % (ref 0.0–0.2)

## 2022-08-30 LAB — BASIC METABOLIC PANEL
Anion gap: 9 (ref 5–15)
BUN: 14 mg/dL (ref 8–23)
CO2: 24 mmol/L (ref 22–32)
Calcium: 8.8 mg/dL — ABNORMAL LOW (ref 8.9–10.3)
Chloride: 109 mmol/L (ref 98–111)
Creatinine, Ser: 1.28 mg/dL — ABNORMAL HIGH (ref 0.61–1.24)
GFR, Estimated: 59 mL/min — ABNORMAL LOW (ref >60)
Glucose, Bld: 108 mg/dL — ABNORMAL HIGH (ref 70–99)
Potassium: 3.6 mmol/L (ref 3.5–5.1)
Sodium: 142 mmol/L (ref 135–145)

## 2022-08-30 LAB — OCCULT BLOOD X 1 CARD TO LAB, STOOL: Fecal Occult Bld: NEGATIVE

## 2022-08-30 MED ORDER — DIPHENHYDRAMINE HCL 50 MG/ML IJ SOLN
25.0000 mg | Freq: Once | INTRAMUSCULAR | Status: AC
  Administered 2022-08-30: 25 mg via INTRAVENOUS
  Filled 2022-08-30: qty 1

## 2022-08-30 MED ORDER — MECLIZINE HCL 25 MG PO TABS: 25.0000 mg | ORAL_TABLET | Freq: Three times a day (TID) | ORAL | 0 refills | Status: AC | PRN

## 2022-08-30 NOTE — ED Provider Notes (Signed)
[facility] Provider Note: Georgena Spurling, MD, FACEP  CSN: [id number] MRN: [id number] ARRIVAL: 08/30/22 at [place]: [place]  Dizziness   HISTORY OF PRESENT ILLNESS  08/30/22 3:13 AM Eric [name] is a 76 y.o. male was admitted on 07/18/2022 for shortness of breath and weakness.  He was found to have a hemoglobin of 10.1 with heme positive stool.  He was admitted and an endoscopy showed peptic ulcer disease without active bleeding.  He was given iron infusions and has been on oral iron tablets.  He has been having dark stools since starting the iron.  Over about the past month he has had episodes of dizziness.  This dizziness is described as a sense of the room spinning and of being off balance.  He cannot tell me how long these episodes last.  They seem to be worse if he is lying down then if he is standing up but he cannot tell me if they are worse with movement of his head.  His worst episode occurred yesterday evening and he is asymptomatic at the present time.  He is having minimal nausea and no vomiting with these episodes.   Past Medical History:  Diagnosis Date   Chest pain    Hypercholesterolemia    Nodular basal cell carcinoma (BCC) 06/24/2017   Right Upper Back (curet, cautery and 5FU)   Nodular basal cell carcinoma (BCC) 09/30/2019   Upper Mid Chest (treatment after biopsy)   Osteoarthritis    Pneumonia    right middle lobe necrotizing pneumonia with abcess as well ass effusion and empyena and decortication    Past Surgical History:  Procedure Laterality Date   BIOPSY  08/20/2022   Procedure: BIOPSY;  Surgeon: Wilford Corner, MD;  Location: [facility];  Service: Gastroenterology;;   CARDIAC CATHETERIZATION     2005   [place] Left 02/25/2018   Procedure: LEFT THUMB TRAPEZIECTOMY WITH SUSPENSIONPLASTY;  Surgeon: Leanora Cover, MD;  Location: [facility];  Service: Orthopedics;  Laterality:  Left;   ESOPHAGOGASTRODUODENOSCOPY (EGD) WITH PROPOFOL N/A 08/20/2022   Procedure: ESOPHAGOGASTRODUODENOSCOPY (EGD) WITH PROPOFOL;  Surgeon: Wilford Corner, MD;  Location: [facility];  Service: Gastroenterology;  Laterality: N/A;   LUMBAR DISC SURGERY      Family History  Problem Relation Age of Onset   Heart disease Father     Social History   Tobacco Use   Smoking status: Never   Smokeless tobacco: Never  Vaping Use   Vaping Use: Never used  Substance Use Topics   Alcohol use: Yes   Drug use: No    Prior to Admission medications   Medication Sig Start Date End Date Taking? Authorizing Provider  meclizine (ANTIVERT) 25 MG tablet Take 1 tablet (25 mg total) by mouth 3 (three) times daily as needed for dizziness. 08/30/22  Yes Raheem Kolbe, MD  atorvastatin (LIPITOR) 40 MG tablet Take 40 mg by mouth daily. 11/11/19   [provider]  ferrous sulfate 325 (65 FE) MG EC tablet Take 1 tablet (325 mg total) by mouth 2 (two) times daily. 08/22/22 08/22/23  Dibia, Manfred Shirts, MD  hydrOXYzine (ATARAX) 25 MG tablet Take 25 mg by mouth in the morning and at bedtime.    [provider]  OVER THE COUNTER MEDICATION Take 1 capsule by mouth daily. Vitamin B po    [provider]  OVER THE COUNTER MEDICATION Take 1 tablet by mouth at bedtime. Condroitin PO  [provider]  pantoprazole (PROTONIX) 40 MG tablet Take 1 tablet (40 mg total) by mouth 2 (two) times daily. 08/22/22 08/22/23  Dibia, Manfred Shirts, MD    Allergies Patient has no known allergies.   REVIEW OF SYSTEMS  Negative except as noted here or in the History of Present Illness.   PHYSICAL EXAMINATION  Initial Vital Signs Blood pressure (!) 139/97, pulse 80, temperature 98.9 F (37.2 C), temperature source Oral, resp. rate 20, weight 83 kg, SpO2 97 %.  Examination General: Well-developed, well-nourished male in no acute distress; appearance consistent with age of record HENT:  normocephalic; atraumatic Eyes: pupils equal, round and reactive to light; extraocular muscles intact; no nystagmus Neck: supple Heart: regular rate and rhythm Lungs: clear to auscultation bilaterally Abdomen: soft; nondistended; nontender; bowel sounds present Rectal: Normal sphincter tone; stool on examining glove brown, sent for Hemoccult testing Extremities: No deformity; full range of motion; pulses normal Neurologic: Awake, alert and oriented; motor function intact in all extremities and symmetric; no facial droop Skin: Warm and dry Psychiatric: Normal mood and affect   RESULTS  Summary of this visit's results, reviewed and interpreted by myself:   EKG Interpretation  Date/Time:  Saturday August 30 2022 02:49:41 EST Ventricular Rate:  82 PR Interval:  194 QRS Duration: 80 QT Interval:  402 QTC Calculation: 469 R Axis:   40 Text Interpretation: Normal sinus rhythm Normal ECG When compared with ECG of 18-Aug-2022 14:09, No significant change was found Confirmed by Shanon Rosser [phone]) on 08/30/2022 2:54:35 AM       Laboratory Studies: Results for orders placed or performed during the hospital encounter of 08/30/22 (from the past 24 hour(s))  Basic metabolic panel     Status: Abnormal   Collection Time: 08/30/22  2:48 AM  Result Value Ref Range   Sodium 142 135 - 145 mmol/L   Potassium 3.6 3.5 - 5.1 mmol/L   Chloride 109 98 - 111 mmol/L   CO2 24 22 - 32 mmol/L   Glucose, Bld 108 (H) 70 - 99 mg/dL   BUN 14 8 - 23 mg/dL   Creatinine, Ser 1.28 (H) 0.61 - 1.24 mg/dL   Calcium 8.8 (L) 8.9 - 10.3 mg/dL   GFR, Estimated 59 (L) >60 mL/min   Anion gap 9 5 - 15  CBC with Differential     Status: Abnormal   Collection Time: 08/30/22  2:48 AM  Result Value Ref Range   WBC 7.9 4.0 - 10.5 K/uL   RBC 3.67 (L) 4.22 - 5.81 MIL/uL   Hemoglobin 11.2 (L) 13.0 - 17.0 g/dL   HCT 33.9 (L) 39.0 - 52.0 %   MCV 92.4 80.0 - 100.0 fL   MCH 30.5 26.0 - 34.0 pg   MCHC 33.0 30.0 - 36.0  g/dL   RDW 14.6 11.5 - 15.5 %   Platelets 240 150 - 400 K/uL   nRBC 0.0 0.0 - 0.2 %   Neutrophils Relative % 43 %   Neutro Abs 3.4 1.7 - 7.7 K/uL   Lymphocytes Relative 43 %   Lymphs Abs 3.4 0.7 - 4.0 K/uL   Monocytes Relative 8 %   Monocytes Absolute 0.6 0.1 - 1.0 K/uL   Eosinophils Relative 4 %   Eosinophils Absolute 0.3 0.0 - 0.5 K/uL   Basophils Relative 1 %   Basophils Absolute 0.1 0.0 - 0.1 K/uL   Immature Granulocytes 1 %   Abs Immature Granulocytes 0.04 0.00 - 0.07 K/uL  Occult blood card to  lab, stool Provider will collect     Status: None   Collection Time: 08/30/22  3:23 AM  Result Value Ref Range   Fecal Occult Bld NEGATIVE NEGATIVE   Imaging Studies: No results found.  ED COURSE and MDM  Nursing notes, initial and subsequent vitals signs, including pulse oximetry, reviewed and interpreted by myself.  Vitals:   08/30/22 0245 08/30/22 0345 08/30/22 0400 08/30/22 0430  BP:  (!) 140/89 132/78 128/81  Pulse:  68 64 62  Resp:  '17 13 13  '$ Temp:      TempSrc:      SpO2:  97% 95% 95%  Weight: 83 kg      Medications  diphenhydrAMINE (BENADRYL) injection 25 mg (25 mg Intravenous Given 08/30/22 0414)   5:21 AM Patient has had no vertigo while in the ED and is now drowsy from being given IV Benadryl.  His symptoms are consistent with peripheral vertigo as it is episodic and not persistent as central vertigo tends to be.  His stool is heme-negative so I suspect his report of dark stools is due to his oral iron therapy.  His hemoglobin has improved since his previous visit.  We will treat with meclizine and refer to his PCP if vertigo episodes persist.   PROCEDURES  Procedures   ED DIAGNOSES     ICD-10-CM   1. Vertigo  R42          Severiano Utsey, MD 08/30/22 [id number]

## 2022-08-30 NOTE — ED Triage Notes (Signed)
Pt presents for dizziness x 2 days. Has recently been treated for low hemoglobin related to suspected GI bleed (evidence of an ulcer not actively bleeding on endo at that time). Received 3 iron infusions and fluids but did not require PRBCs.   Endorses dark stools, slight SOB (not as severe as previous episode).   Denies fevers, abd pain, back pain, urinary sx  BP 139/97 in triage, HR 78

## 2022-09-03 DIAGNOSIS — K922 Gastrointestinal hemorrhage, unspecified: Secondary | ICD-10-CM | POA: Diagnosis not present

## 2022-09-03 DIAGNOSIS — R748 Abnormal levels of other serum enzymes: Secondary | ICD-10-CM | POA: Diagnosis not present

## 2022-09-03 DIAGNOSIS — R42 Dizziness and giddiness: Secondary | ICD-10-CM | POA: Diagnosis not present

## 2022-09-11 ENCOUNTER — Telehealth: Payer: Self-pay

## 2022-09-11 NOTE — Telephone Encounter (Signed)
     Patient  visit on 08/30/2022  at [facility] was for Dizziness and giddiness.  Have you been able to follow up with your primary care physician? Yes  The patient was or was not able to obtain any needed medicine or equipment. No medication prescribed.  Are there diet recommendations that you are having difficulty following? No  Patient expresses understanding of discharge instructions and education provided has no other needs at this time.    [place] Resource Care Guide   [email or web address]  ?? [phone]   Website: [email or web address]  [email or web address]

## 2022-10-09 DIAGNOSIS — K259 Gastric ulcer, unspecified as acute or chronic, without hemorrhage or perforation: Secondary | ICD-10-CM | POA: Diagnosis not present

## 2022-10-09 DIAGNOSIS — Z8719 Personal history of other diseases of the digestive system: Secondary | ICD-10-CM | POA: Diagnosis not present

## 2022-10-16 DIAGNOSIS — J470 Bronchiectasis with acute lower respiratory infection: Secondary | ICD-10-CM | POA: Diagnosis not present

## 2022-10-28 DIAGNOSIS — M47812 Spondylosis without myelopathy or radiculopathy, cervical region: Secondary | ICD-10-CM | POA: Diagnosis not present

## 2022-10-28 DIAGNOSIS — Z8711 Personal history of peptic ulcer disease: Secondary | ICD-10-CM | POA: Diagnosis not present

## 2022-11-04 DIAGNOSIS — M25531 Pain in right wrist: Secondary | ICD-10-CM | POA: Diagnosis not present

## 2022-11-04 DIAGNOSIS — M25532 Pain in left wrist: Secondary | ICD-10-CM | POA: Diagnosis not present

## 2022-11-10 DIAGNOSIS — M47812 Spondylosis without myelopathy or radiculopathy, cervical region: Secondary | ICD-10-CM | POA: Diagnosis not present

## 2022-11-17 DIAGNOSIS — M47812 Spondylosis without myelopathy or radiculopathy, cervical region: Secondary | ICD-10-CM | POA: Diagnosis not present

## 2022-11-20 DIAGNOSIS — J300 Vasomotor rhinitis: Secondary | ICD-10-CM | POA: Diagnosis not present

## 2022-11-24 DIAGNOSIS — M47812 Spondylosis without myelopathy or radiculopathy, cervical region: Secondary | ICD-10-CM | POA: Diagnosis not present

## 2022-11-24 DIAGNOSIS — M545 Low back pain, unspecified: Secondary | ICD-10-CM | POA: Diagnosis not present

## 2022-11-27 DIAGNOSIS — M47812 Spondylosis without myelopathy or radiculopathy, cervical region: Secondary | ICD-10-CM | POA: Diagnosis not present

## 2022-11-27 DIAGNOSIS — M545 Low back pain, unspecified: Secondary | ICD-10-CM | POA: Diagnosis not present

## 2022-12-01 DIAGNOSIS — M545 Low back pain, unspecified: Secondary | ICD-10-CM | POA: Diagnosis not present

## 2022-12-01 DIAGNOSIS — M47812 Spondylosis without myelopathy or radiculopathy, cervical region: Secondary | ICD-10-CM | POA: Diagnosis not present

## 2022-12-04 DIAGNOSIS — M545 Low back pain, unspecified: Secondary | ICD-10-CM | POA: Diagnosis not present

## 2022-12-04 DIAGNOSIS — M47812 Spondylosis without myelopathy or radiculopathy, cervical region: Secondary | ICD-10-CM | POA: Diagnosis not present

## 2022-12-05 DIAGNOSIS — J479 Bronchiectasis, uncomplicated: Secondary | ICD-10-CM | POA: Diagnosis not present

## 2022-12-05 DIAGNOSIS — J3089 Other allergic rhinitis: Secondary | ICD-10-CM | POA: Diagnosis not present

## 2022-12-05 DIAGNOSIS — L299 Pruritus, unspecified: Secondary | ICD-10-CM | POA: Diagnosis not present

## 2022-12-05 DIAGNOSIS — J3081 Allergic rhinitis due to animal (cat) (dog) hair and dander: Secondary | ICD-10-CM | POA: Diagnosis not present

## 2022-12-08 DIAGNOSIS — M47812 Spondylosis without myelopathy or radiculopathy, cervical region: Secondary | ICD-10-CM | POA: Diagnosis not present

## 2022-12-08 DIAGNOSIS — M545 Low back pain, unspecified: Secondary | ICD-10-CM | POA: Diagnosis not present

## 2022-12-10 DIAGNOSIS — M47812 Spondylosis without myelopathy or radiculopathy, cervical region: Secondary | ICD-10-CM | POA: Diagnosis not present

## 2022-12-10 DIAGNOSIS — M545 Low back pain, unspecified: Secondary | ICD-10-CM | POA: Diagnosis not present

## 2022-12-15 DIAGNOSIS — M47812 Spondylosis without myelopathy or radiculopathy, cervical region: Secondary | ICD-10-CM | POA: Diagnosis not present

## 2022-12-15 DIAGNOSIS — M545 Low back pain, unspecified: Secondary | ICD-10-CM | POA: Diagnosis not present

## 2022-12-18 DIAGNOSIS — M47812 Spondylosis without myelopathy or radiculopathy, cervical region: Secondary | ICD-10-CM | POA: Diagnosis not present

## 2022-12-18 DIAGNOSIS — M545 Low back pain, unspecified: Secondary | ICD-10-CM | POA: Diagnosis not present

## 2022-12-22 DIAGNOSIS — M47812 Spondylosis without myelopathy or radiculopathy, cervical region: Secondary | ICD-10-CM | POA: Diagnosis not present

## 2022-12-22 DIAGNOSIS — M545 Low back pain, unspecified: Secondary | ICD-10-CM | POA: Diagnosis not present

## 2022-12-23 DIAGNOSIS — Z85828 Personal history of other malignant neoplasm of skin: Secondary | ICD-10-CM | POA: Diagnosis not present

## 2022-12-23 DIAGNOSIS — D229 Melanocytic nevi, unspecified: Secondary | ICD-10-CM | POA: Diagnosis not present

## 2022-12-23 DIAGNOSIS — L821 Other seborrheic keratosis: Secondary | ICD-10-CM | POA: Diagnosis not present

## 2022-12-23 DIAGNOSIS — L570 Actinic keratosis: Secondary | ICD-10-CM | POA: Diagnosis not present

## 2022-12-23 DIAGNOSIS — L820 Inflamed seborrheic keratosis: Secondary | ICD-10-CM | POA: Diagnosis not present

## 2022-12-23 DIAGNOSIS — D1801 Hemangioma of skin and subcutaneous tissue: Secondary | ICD-10-CM | POA: Diagnosis not present

## 2022-12-25 DIAGNOSIS — M545 Low back pain, unspecified: Secondary | ICD-10-CM | POA: Diagnosis not present

## 2022-12-25 DIAGNOSIS — M47812 Spondylosis without myelopathy or radiculopathy, cervical region: Secondary | ICD-10-CM | POA: Diagnosis not present

## 2022-12-29 DIAGNOSIS — M545 Low back pain, unspecified: Secondary | ICD-10-CM | POA: Diagnosis not present

## 2022-12-29 DIAGNOSIS — M47812 Spondylosis without myelopathy or radiculopathy, cervical region: Secondary | ICD-10-CM | POA: Diagnosis not present

## 2023-01-01 DIAGNOSIS — M47812 Spondylosis without myelopathy or radiculopathy, cervical region: Secondary | ICD-10-CM | POA: Diagnosis not present

## 2023-01-01 DIAGNOSIS — M545 Low back pain, unspecified: Secondary | ICD-10-CM | POA: Diagnosis not present

## 2023-01-05 DIAGNOSIS — M545 Low back pain, unspecified: Secondary | ICD-10-CM | POA: Diagnosis not present

## 2023-01-05 DIAGNOSIS — M47812 Spondylosis without myelopathy or radiculopathy, cervical region: Secondary | ICD-10-CM | POA: Diagnosis not present

## 2023-01-07 DIAGNOSIS — M47812 Spondylosis without myelopathy or radiculopathy, cervical region: Secondary | ICD-10-CM | POA: Diagnosis not present

## 2023-01-07 DIAGNOSIS — M545 Low back pain, unspecified: Secondary | ICD-10-CM | POA: Diagnosis not present

## 2023-01-13 DIAGNOSIS — M47812 Spondylosis without myelopathy or radiculopathy, cervical region: Secondary | ICD-10-CM | POA: Diagnosis not present

## 2023-01-13 DIAGNOSIS — M545 Low back pain, unspecified: Secondary | ICD-10-CM | POA: Diagnosis not present

## 2023-01-21 DIAGNOSIS — M47812 Spondylosis without myelopathy or radiculopathy, cervical region: Secondary | ICD-10-CM | POA: Diagnosis not present

## 2023-01-21 DIAGNOSIS — M545 Low back pain, unspecified: Secondary | ICD-10-CM | POA: Diagnosis not present

## 2023-02-11 DIAGNOSIS — M545 Low back pain, unspecified: Secondary | ICD-10-CM | POA: Diagnosis not present

## 2023-02-11 DIAGNOSIS — D649 Anemia, unspecified: Secondary | ICD-10-CM | POA: Diagnosis not present

## 2023-02-11 DIAGNOSIS — M47812 Spondylosis without myelopathy or radiculopathy, cervical region: Secondary | ICD-10-CM | POA: Diagnosis not present

## 2023-02-17 DIAGNOSIS — M545 Low back pain, unspecified: Secondary | ICD-10-CM | POA: Diagnosis not present

## 2023-02-17 DIAGNOSIS — M47812 Spondylosis without myelopathy or radiculopathy, cervical region: Secondary | ICD-10-CM | POA: Diagnosis not present

## 2023-02-19 DIAGNOSIS — M545 Low back pain, unspecified: Secondary | ICD-10-CM | POA: Diagnosis not present

## 2023-02-19 DIAGNOSIS — M47812 Spondylosis without myelopathy or radiculopathy, cervical region: Secondary | ICD-10-CM | POA: Diagnosis not present

## 2023-02-24 DIAGNOSIS — M47812 Spondylosis without myelopathy or radiculopathy, cervical region: Secondary | ICD-10-CM | POA: Diagnosis not present

## 2023-02-24 DIAGNOSIS — M545 Low back pain, unspecified: Secondary | ICD-10-CM | POA: Diagnosis not present

## 2023-02-25 DIAGNOSIS — J479 Bronchiectasis, uncomplicated: Secondary | ICD-10-CM | POA: Diagnosis not present

## 2023-02-25 DIAGNOSIS — E7800 Pure hypercholesterolemia, unspecified: Secondary | ICD-10-CM | POA: Diagnosis not present

## 2023-02-25 DIAGNOSIS — M47812 Spondylosis without myelopathy or radiculopathy, cervical region: Secondary | ICD-10-CM | POA: Diagnosis not present

## 2023-02-25 DIAGNOSIS — D649 Anemia, unspecified: Secondary | ICD-10-CM | POA: Diagnosis not present

## 2023-02-25 DIAGNOSIS — I700 Atherosclerosis of aorta: Secondary | ICD-10-CM | POA: Diagnosis not present

## 2023-02-25 DIAGNOSIS — M5442 Lumbago with sciatica, left side: Secondary | ICD-10-CM | POA: Diagnosis not present

## 2023-02-25 DIAGNOSIS — Z0000 Encounter for general adult medical examination without abnormal findings: Secondary | ICD-10-CM | POA: Diagnosis not present

## 2023-02-25 DIAGNOSIS — Z8711 Personal history of peptic ulcer disease: Secondary | ICD-10-CM | POA: Diagnosis not present

## 2023-02-25 DIAGNOSIS — N1831 Chronic kidney disease, stage 3a: Secondary | ICD-10-CM | POA: Diagnosis not present

## 2023-02-25 DIAGNOSIS — R030 Elevated blood-pressure reading, without diagnosis of hypertension: Secondary | ICD-10-CM | POA: Diagnosis not present

## 2023-02-25 DIAGNOSIS — G8929 Other chronic pain: Secondary | ICD-10-CM | POA: Diagnosis not present

## 2023-02-25 DIAGNOSIS — K219 Gastro-esophageal reflux disease without esophagitis: Secondary | ICD-10-CM | POA: Diagnosis not present

## 2023-03-05 DIAGNOSIS — M545 Low back pain, unspecified: Secondary | ICD-10-CM | POA: Diagnosis not present

## 2023-03-05 DIAGNOSIS — M47812 Spondylosis without myelopathy or radiculopathy, cervical region: Secondary | ICD-10-CM | POA: Diagnosis not present

## 2023-03-12 DIAGNOSIS — M47812 Spondylosis without myelopathy or radiculopathy, cervical region: Secondary | ICD-10-CM | POA: Diagnosis not present

## 2023-03-12 DIAGNOSIS — R899 Unspecified abnormal finding in specimens from other organs, systems and tissues: Secondary | ICD-10-CM | POA: Diagnosis not present

## 2023-03-12 DIAGNOSIS — M545 Low back pain, unspecified: Secondary | ICD-10-CM | POA: Diagnosis not present

## 2023-03-13 DIAGNOSIS — H6121 Impacted cerumen, right ear: Secondary | ICD-10-CM | POA: Diagnosis not present

## 2023-05-19 DIAGNOSIS — S30861A Insect bite (nonvenomous) of abdominal wall, initial encounter: Secondary | ICD-10-CM | POA: Diagnosis not present

## 2023-05-19 DIAGNOSIS — D225 Melanocytic nevi of trunk: Secondary | ICD-10-CM | POA: Diagnosis not present

## 2023-05-19 DIAGNOSIS — L821 Other seborrheic keratosis: Secondary | ICD-10-CM | POA: Diagnosis not present

## 2023-05-19 DIAGNOSIS — D1801 Hemangioma of skin and subcutaneous tissue: Secondary | ICD-10-CM | POA: Diagnosis not present

## 2023-05-19 DIAGNOSIS — D2261 Melanocytic nevi of right upper limb, including shoulder: Secondary | ICD-10-CM | POA: Diagnosis not present

## 2023-05-19 DIAGNOSIS — L570 Actinic keratosis: Secondary | ICD-10-CM | POA: Diagnosis not present

## 2023-05-26 DIAGNOSIS — M1811 Unilateral primary osteoarthritis of first carpometacarpal joint, right hand: Secondary | ICD-10-CM | POA: Diagnosis not present

## 2023-05-26 DIAGNOSIS — M19032 Primary osteoarthritis, left wrist: Secondary | ICD-10-CM | POA: Diagnosis not present

## 2023-06-16 DIAGNOSIS — M19032 Primary osteoarthritis, left wrist: Secondary | ICD-10-CM | POA: Diagnosis not present

## 2023-06-16 DIAGNOSIS — M1811 Unilateral primary osteoarthritis of first carpometacarpal joint, right hand: Secondary | ICD-10-CM | POA: Diagnosis not present

## 2023-06-24 DIAGNOSIS — Z23 Encounter for immunization: Secondary | ICD-10-CM | POA: Diagnosis not present

## 2023-07-07 DIAGNOSIS — M1811 Unilateral primary osteoarthritis of first carpometacarpal joint, right hand: Secondary | ICD-10-CM | POA: Diagnosis not present

## 2023-07-08 DIAGNOSIS — M19032 Primary osteoarthritis, left wrist: Secondary | ICD-10-CM | POA: Diagnosis not present

## 2023-08-19 ENCOUNTER — Other Ambulatory Visit: Payer: Self-pay | Admitting: Gastroenterology

## 2023-08-19 DIAGNOSIS — K7689 Other specified diseases of liver: Secondary | ICD-10-CM

## 2023-08-19 DIAGNOSIS — N281 Cyst of kidney, acquired: Secondary | ICD-10-CM

## 2023-08-20 DIAGNOSIS — M778 Other enthesopathies, not elsewhere classified: Secondary | ICD-10-CM | POA: Diagnosis not present

## 2023-08-24 ENCOUNTER — Ambulatory Visit
Admission: RE | Admit: 2023-08-24 | Discharge: 2023-08-24 | Disposition: A | Discharge status: Home / Self Care | Payer: Medicare Other | Source: Ambulatory Visit | Attending: Gastroenterology | Admitting: Gastroenterology

## 2023-08-24 DIAGNOSIS — N281 Cyst of kidney, acquired: Secondary | ICD-10-CM | POA: Diagnosis not present

## 2023-08-24 DIAGNOSIS — K7689 Other specified diseases of liver: Secondary | ICD-10-CM

## 2023-09-22 DIAGNOSIS — K760 Fatty (change of) liver, not elsewhere classified: Secondary | ICD-10-CM | POA: Diagnosis not present

## 2023-09-22 DIAGNOSIS — K219 Gastro-esophageal reflux disease without esophagitis: Secondary | ICD-10-CM | POA: Diagnosis not present

## 2023-10-19 DIAGNOSIS — K219 Gastro-esophageal reflux disease without esophagitis: Secondary | ICD-10-CM | POA: Diagnosis not present

## 2023-10-19 DIAGNOSIS — R053 Chronic cough: Secondary | ICD-10-CM | POA: Diagnosis not present

## 2023-10-19 DIAGNOSIS — J470 Bronchiectasis with acute lower respiratory infection: Secondary | ICD-10-CM | POA: Diagnosis not present

## 2023-11-19 ENCOUNTER — Encounter: Payer: Self-pay | Admitting: Podiatry

## 2023-11-19 ENCOUNTER — Ambulatory Visit (INDEPENDENT_AMBULATORY_CARE_PROVIDER_SITE_OTHER): Payer: Medicare Other

## 2023-11-19 ENCOUNTER — Ambulatory Visit (INDEPENDENT_AMBULATORY_CARE_PROVIDER_SITE_OTHER): Payer: Medicare Other | Admitting: Podiatry

## 2023-11-19 DIAGNOSIS — M779 Enthesopathy, unspecified: Secondary | ICD-10-CM

## 2023-11-19 DIAGNOSIS — M109 Gout, unspecified: Secondary | ICD-10-CM | POA: Diagnosis not present

## 2023-11-19 DIAGNOSIS — M79671 Pain in right foot: Secondary | ICD-10-CM | POA: Diagnosis not present

## 2023-11-19 MED ORDER — TRIAMCINOLONE ACETONIDE 10 MG/ML IJ SUSP
10.0000 mg | Freq: Once | INTRAMUSCULAR | Status: AC
  Administered 2023-11-19: 10 mg via INTRA_ARTICULAR

## 2023-11-19 MED ORDER — METHYLPREDNISOLONE 4 MG PO TBPK: ORAL_TABLET | ORAL | 0 refills | Status: AC

## 2023-11-19 NOTE — Patient Instructions (Signed)
 Gout  Gout is painful swelling of your joints. Gout is a type of arthritis. It is caused by having too much uric acid in your body. Uric acid is a chemical that is made when your body breaks down substances called purines. If your body has too much uric acid, sharp crystals can form and build up in your joints. This causes pain and swelling. Gout attacks can happen quickly and be very painful (acute gout). Over time, the attacks can affect more joints and happen more often (chronic gout). What are the causes? Gout is caused by too much uric acid in your blood. This can happen because: Your kidneys do not remove enough uric acid from your blood. Your body makes too much uric acid. You eat too many foods that are high in purines. These foods include organ meats, some seafood, and beer. Trauma or stress can bring on an attack. What increases the risk? Having a family history of gout. Being male and middle-aged. Being male and having gone through menopause. Having an organ transplant. Taking certain medicines. Having certain conditions, such as: Being very overweight (obese). Lead poisoning. Kidney disease. A skin condition called psoriasis. Other risks include: Losing weight too quickly. Not having enough water in the body (being dehydrated). Drinking alcohol, especially beer. Drinking beverages that are sweetened with a type of sugar called fructose. What are the signs or symptoms? An attack of acute gout often starts at night and usually happens in just one joint. The most common place is the big toe. Other joints that may be affected include joints of the feet, ankle, knee, fingers, wrist, or elbow. Symptoms may include: Very bad pain. Warmth. Swelling. Stiffness. Tenderness. The affected joint may be very painful to touch. Shiny, red, or purple skin. Chills and fever. Chronic gout may cause symptoms more often. More joints may be involved. You may also have white or yellow lumps  (tophi) on your hands or feet or in other areas near your joints. How is this treated? Treatment for an acute attack may include medicines for pain and swelling, such as: NSAIDs, such as ibuprofen. Steroids taken by mouth or injected into a joint. Colchicine. This can be given by mouth or through an IV tube. Treatment to prevent future attacks may include: Taking small doses of NSAIDs or colchicine daily. Using a medicine that reduces uric acid levels in your blood, such as allopurinol. Making changes to your diet. You may need to see a food expert (dietitian) about what to eat and drink to prevent gout. Follow these instructions at home: During a gout attack  If told, put ice on the painful area. To do this: Put ice in a plastic bag. Place a towel between your skin and the bag. Leave the ice on for 20 minutes, 2-3 times a day. Take off the ice if your skin turns bright red. This is very important. If you cannot feel pain, heat, or cold, you have a greater risk of damage to the area. Raise the painful joint above the level of your heart as often as you can. Rest the joint as much as possible. If the joint is in your leg, you may be given crutches. Follow instructions from your doctor about what you cannot eat or drink. Avoiding future gout attacks Eat a low-purine diet. Avoid foods and drinks such as: Liver. Kidney. Anchovies. Asparagus. Herring. Mushrooms. Mussels. Beer. Stay at a healthy weight. If you want to lose weight, talk with your doctor. Do not  lose weight too fast. Start or continue an exercise plan as told by your doctor. Eating and drinking Avoid drinks sweetened by fructose. Drink enough fluids to keep your pee (urine) pale yellow. If you drink alcohol: Limit how much you have to: 0-1 drink a day for women who are not pregnant. 0-2 drinks a day for men. Know how much alcohol is in a drink. In the [place] one drink equals one 12 oz bottle of beer (355 mL), one 5 oz  glass of wine (148 mL), or one 1 oz glass of hard liquor (44 mL). General instructions Take over-the-counter and prescription medicines only as told by your doctor. Ask your doctor if you should avoid driving or using machines while you are taking your medicine. Return to your normal activities when your doctor says that it is safe. Keep all follow-up visits. Where to find more information [employer]: www.niams.http://www.myers.net/ Contact a doctor if: You have another gout attack. You still have symptoms of a gout attack after 10 days of treatment. You have problems (side effects) because of your medicines. You have chills or a fever. You have burning pain when you pee (urinate). You have pain in your lower back or belly. Get help right away if: You have very bad pain. Your pain cannot be controlled. You cannot pee. Summary Gout is painful swelling of the joints. The most common site of pain is the big toe, but it can affect other joints. Medicines and avoiding some foods can help to prevent and treat gout attacks. This information is not intended to replace advice given to you by your health care provider. Make sure you discuss any questions you have with your health care provider. Document Revised: 07/03/2021 Document Reviewed: 07/03/2021 Elsevier Patient Education  2024 [employer].

## 2023-11-19 NOTE — Progress Notes (Signed)
 Subjective:   Patient ID: Eric [name], male   DOB: 78 y.o.   MRN: [id number]   HPI Patient states he has developed a lot of pain in his right forefoot and also is wondering about gout as he has had problems with a lot of different joints.  States that the foot all of a sudden became inflamed several days ago   ROS      Objective:  Physical Exam  Neuro ocular status was found to be intact inflammation fluid around the lesser MPJs right and into the forefoot with redness and swelling noted with upon questioning history of other joints becoming inflamed at different times     Assessment:  Appears to be an acute inflammatory condition with capsulitis tendinitis inflammation right and also secondarily has a good potential for gout like symptomatology     Plan:  H&P both conditions reviewed separately gout I did give him paperwork on foods to avoid and placed on Medrol  Dosepak and discussed possible allopurinol and I went ahead today did sterile prep and injected around the lesser MPJs periarticular 3 mg dexamethasone  Kenalog  5 mg Xylocaine   X-rays were negative for fracture appears to be soft tissue

## 2023-12-08 DIAGNOSIS — Z85828 Personal history of other malignant neoplasm of skin: Secondary | ICD-10-CM | POA: Diagnosis not present

## 2023-12-08 DIAGNOSIS — L570 Actinic keratosis: Secondary | ICD-10-CM | POA: Diagnosis not present

## 2023-12-22 DIAGNOSIS — M19032 Primary osteoarthritis, left wrist: Secondary | ICD-10-CM | POA: Diagnosis not present

## 2024-02-29 DIAGNOSIS — Z0000 Encounter for general adult medical examination without abnormal findings: Secondary | ICD-10-CM | POA: Diagnosis not present

## 2024-02-29 DIAGNOSIS — R946 Abnormal results of thyroid function studies: Secondary | ICD-10-CM | POA: Diagnosis not present

## 2024-02-29 DIAGNOSIS — J479 Bronchiectasis, uncomplicated: Secondary | ICD-10-CM | POA: Diagnosis not present

## 2024-02-29 DIAGNOSIS — M47812 Spondylosis without myelopathy or radiculopathy, cervical region: Secondary | ICD-10-CM | POA: Diagnosis not present

## 2024-02-29 DIAGNOSIS — N1831 Chronic kidney disease, stage 3a: Secondary | ICD-10-CM | POA: Diagnosis not present

## 2024-02-29 DIAGNOSIS — K909 Intestinal malabsorption, unspecified: Secondary | ICD-10-CM | POA: Diagnosis not present

## 2024-02-29 DIAGNOSIS — F5101 Primary insomnia: Secondary | ICD-10-CM | POA: Diagnosis not present

## 2024-02-29 DIAGNOSIS — K760 Fatty (change of) liver, not elsewhere classified: Secondary | ICD-10-CM | POA: Diagnosis not present

## 2024-02-29 DIAGNOSIS — E7800 Pure hypercholesterolemia, unspecified: Secondary | ICD-10-CM | POA: Diagnosis not present

## 2024-02-29 DIAGNOSIS — Z79899 Other long term (current) drug therapy: Secondary | ICD-10-CM | POA: Diagnosis not present

## 2024-02-29 DIAGNOSIS — G8929 Other chronic pain: Secondary | ICD-10-CM | POA: Diagnosis not present

## 2024-02-29 DIAGNOSIS — Z23 Encounter for immunization: Secondary | ICD-10-CM | POA: Diagnosis not present

## 2024-02-29 DIAGNOSIS — M5442 Lumbago with sciatica, left side: Secondary | ICD-10-CM | POA: Diagnosis not present

## 2024-03-03 DIAGNOSIS — M545 Low back pain, unspecified: Secondary | ICD-10-CM | POA: Diagnosis not present

## 2024-03-03 DIAGNOSIS — M47812 Spondylosis without myelopathy or radiculopathy, cervical region: Secondary | ICD-10-CM | POA: Diagnosis not present

## 2024-03-08 DIAGNOSIS — J069 Acute upper respiratory infection, unspecified: Secondary | ICD-10-CM | POA: Diagnosis not present

## 2024-03-08 DIAGNOSIS — J0100 Acute maxillary sinusitis, unspecified: Secondary | ICD-10-CM | POA: Diagnosis not present

## 2024-04-01 DIAGNOSIS — Z23 Encounter for immunization: Secondary | ICD-10-CM | POA: Diagnosis not present

## 2024-04-13 DIAGNOSIS — E7800 Pure hypercholesterolemia, unspecified: Secondary | ICD-10-CM | POA: Diagnosis not present

## 2024-04-13 DIAGNOSIS — Z713 Dietary counseling and surveillance: Secondary | ICD-10-CM | POA: Diagnosis not present

## 2024-04-21 DIAGNOSIS — Z85828 Personal history of other malignant neoplasm of skin: Secondary | ICD-10-CM | POA: Diagnosis not present

## 2024-04-21 DIAGNOSIS — L720 Epidermal cyst: Secondary | ICD-10-CM | POA: Diagnosis not present

## 2024-04-27 DIAGNOSIS — M545 Low back pain, unspecified: Secondary | ICD-10-CM | POA: Diagnosis not present

## 2024-04-27 DIAGNOSIS — M47812 Spondylosis without myelopathy or radiculopathy, cervical region: Secondary | ICD-10-CM | POA: Diagnosis not present

## 2024-05-05 DIAGNOSIS — M545 Low back pain, unspecified: Secondary | ICD-10-CM | POA: Diagnosis not present

## 2024-05-05 DIAGNOSIS — M47812 Spondylosis without myelopathy or radiculopathy, cervical region: Secondary | ICD-10-CM | POA: Diagnosis not present

## 2024-06-23 DIAGNOSIS — L814 Other melanin hyperpigmentation: Secondary | ICD-10-CM | POA: Diagnosis not present

## 2024-06-23 DIAGNOSIS — M720 Palmar fascial fibromatosis [Dupuytren]: Secondary | ICD-10-CM | POA: Diagnosis not present

## 2024-06-23 DIAGNOSIS — D692 Other nonthrombocytopenic purpura: Secondary | ICD-10-CM | POA: Diagnosis not present

## 2024-06-23 DIAGNOSIS — L821 Other seborrheic keratosis: Secondary | ICD-10-CM | POA: Diagnosis not present

## 2024-06-23 DIAGNOSIS — Z85828 Personal history of other malignant neoplasm of skin: Secondary | ICD-10-CM | POA: Diagnosis not present

## 2024-06-23 DIAGNOSIS — D171 Benign lipomatous neoplasm of skin and subcutaneous tissue of trunk: Secondary | ICD-10-CM | POA: Diagnosis not present

## 2024-06-23 DIAGNOSIS — L720 Epidermal cyst: Secondary | ICD-10-CM | POA: Diagnosis not present

## 2024-06-23 DIAGNOSIS — D225 Melanocytic nevi of trunk: Secondary | ICD-10-CM | POA: Diagnosis not present

## 2024-06-23 DIAGNOSIS — D1801 Hemangioma of skin and subcutaneous tissue: Secondary | ICD-10-CM | POA: Diagnosis not present

## 2024-06-29 DIAGNOSIS — Z23 Encounter for immunization: Secondary | ICD-10-CM | POA: Diagnosis not present

## 2024-06-30 DIAGNOSIS — Z1330 Encounter for screening examination for mental health and behavioral disorders, unspecified: Secondary | ICD-10-CM | POA: Diagnosis not present

## 2024-06-30 DIAGNOSIS — M545 Low back pain, unspecified: Secondary | ICD-10-CM | POA: Diagnosis not present

## 2024-07-13 DIAGNOSIS — Z713 Dietary counseling and surveillance: Secondary | ICD-10-CM | POA: Diagnosis not present

## 2024-07-13 DIAGNOSIS — E7800 Pure hypercholesterolemia, unspecified: Secondary | ICD-10-CM | POA: Diagnosis not present

## 2024-09-26 DIAGNOSIS — K219 Gastro-esophageal reflux disease without esophagitis: Secondary | ICD-10-CM | POA: Diagnosis not present

## 2024-09-26 DIAGNOSIS — K760 Fatty (change of) liver, not elsewhere classified: Secondary | ICD-10-CM | POA: Diagnosis not present

## 2024-09-26 DIAGNOSIS — R945 Abnormal results of liver function studies: Secondary | ICD-10-CM | POA: Diagnosis not present

## 2024-09-26 DIAGNOSIS — Z8711 Personal history of peptic ulcer disease: Secondary | ICD-10-CM | POA: Diagnosis not present
